# Patient Record
Sex: Female | Born: 1985
Health system: Southern US, Community
[De-identification: ages and names within clinical notes are randomized; demographics above are authoritative.]

## PROBLEM LIST (undated history)

## (undated) ENCOUNTER — Inpatient Hospital Stay (HOSPITAL_COMMUNITY): Payer: Self-pay

## (undated) DIAGNOSIS — J4520 Mild intermittent asthma, uncomplicated: Secondary | ICD-10-CM

## (undated) DIAGNOSIS — J302 Other seasonal allergic rhinitis: Secondary | ICD-10-CM

## (undated) DIAGNOSIS — K219 Gastro-esophageal reflux disease without esophagitis: Secondary | ICD-10-CM

## (undated) DIAGNOSIS — N859 Noninflammatory disorder of uterus, unspecified: Secondary | ICD-10-CM

## (undated) DIAGNOSIS — G43909 Migraine, unspecified, not intractable, without status migrainosus: Secondary | ICD-10-CM

## (undated) DIAGNOSIS — N926 Irregular menstruation, unspecified: Secondary | ICD-10-CM

## (undated) DIAGNOSIS — K589 Irritable bowel syndrome without diarrhea: Secondary | ICD-10-CM

## (undated) DIAGNOSIS — J45909 Unspecified asthma, uncomplicated: Secondary | ICD-10-CM

## (undated) DIAGNOSIS — R51 Headache: Secondary | ICD-10-CM

## (undated) DIAGNOSIS — R519 Headache, unspecified: Secondary | ICD-10-CM

## (undated) DIAGNOSIS — T7840XA Allergy, unspecified, initial encounter: Secondary | ICD-10-CM

## (undated) DIAGNOSIS — T8859XA Other complications of anesthesia, initial encounter: Secondary | ICD-10-CM

## (undated) HISTORY — DX: Headache: R51

## (undated) HISTORY — DX: Allergy, unspecified, initial encounter: T78.40XA

## (undated) HISTORY — DX: Migraine, unspecified, not intractable, without status migrainosus: G43.909

## (undated) HISTORY — DX: Headache, unspecified: R51.9

---

## 2009-02-13 DIAGNOSIS — Z87410 Personal history of cervical dysplasia: Secondary | ICD-10-CM

## 2009-02-13 HISTORY — DX: Personal history of cervical dysplasia: Z87.410

## 2009-02-13 HISTORY — PX: CERVICAL BIOPSY  W/ LOOP ELECTRODE EXCISION: SUR135

## 2012-09-09 ENCOUNTER — Encounter: Payer: Self-pay | Admitting: Gynecology

## 2012-09-09 ENCOUNTER — Ambulatory Visit (INDEPENDENT_AMBULATORY_CARE_PROVIDER_SITE_OTHER): Payer: BC Managed Care – PPO | Admitting: Gynecology

## 2012-09-09 VITALS — BP 110/60 | HR 80 | Resp 18 | Ht 62.5 in | Wt 158.0 lb

## 2012-09-09 DIAGNOSIS — Z87898 Personal history of other specified conditions: Secondary | ICD-10-CM | POA: Insufficient documentation

## 2012-09-09 DIAGNOSIS — Z8742 Personal history of other diseases of the female genital tract: Secondary | ICD-10-CM

## 2012-09-09 DIAGNOSIS — Z0000 Encounter for general adult medical examination without abnormal findings: Secondary | ICD-10-CM

## 2012-09-09 DIAGNOSIS — Z01419 Encounter for gynecological examination (general) (routine) without abnormal findings: Secondary | ICD-10-CM

## 2012-09-09 DIAGNOSIS — Z309 Encounter for contraceptive management, unspecified: Secondary | ICD-10-CM

## 2012-09-09 LAB — POCT URINALYSIS DIPSTICK: pH, UA: 7

## 2012-09-09 MED ORDER — ETONOGESTREL-ETHINYL ESTRADIOL 0.12-0.015 MG/24HR VA RING: VAGINAL_RING | VAGINAL | Status: DC

## 2012-09-09 NOTE — Progress Notes (Signed)
27 y.o. Married Caucasian female   G1P1001 here for annual exam. Pt is currently sexually active.  She reports  using condoms on a regular basis.  First sexual activity at 27 years old, 4number of lifetime partners. Pt is s/p c/s 78m ago for PPROM at 36w, pt delivered in [place]     Patient's last menstrual period was 08/14/2012.          Sexually active: yes  The current method of family planning is NuvaRing vaginal inserts.    Exercising: yes  walking sometimes Last pap: 7/13- Normal Alcohol: 5 drinks/month Tobacco: no Drugs: no Gardisil: not sure, completed:  Hgb: 12.9       Urine: Leuks 1  No health maintenance topics applied.  Family History  Problem Relation Age of Onset  . Cancer Maternal Aunt   . Osteoporosis Maternal Grandmother     There are no active problems to display for this patient.   No past medical history on file.  Past Surgical History  Procedure Laterality Date  . Cesarean section    . Cervical biopsy  w/ loop electrode excision  2011    HGSIL    Allergies: Penicillins  Current Outpatient Prescriptions  Medication Sig Dispense Refill  . Acetaminophen (TYLENOL PO) Take by mouth as needed.      Marland Kitchen albuterol (PROAIR HFA) 108 (90 BASE) MCG/ACT inhaler Inhale 2 puffs into the lungs every 6 (six) hours as needed for wheezing.      Marland Kitchen etonogestrel-ethinyl estradiol (NUVARING) 0.12-0.015 MG/24HR vaginal ring Place 1 each vaginally every 28 (twenty-eight) days. Insert vaginally and leave in place for 3 consecutive weeks, then remove for 1 week.       No current facility-administered medications for this visit.    ROS: Pertinent items are noted in HPI.  Exam:    LMP 08/14/2012 Weight change: @WEIGHTCHANGE @ Last 3 height recordings:  Ht Readings from Last 3 Encounters:  No data found for Ht   General appearance: alert, cooperative and appears stated age Head: Normocephalic, without obvious abnormality, atraumatic Neck: no adenopathy, no carotid bruit, no JVD,  supple, symmetrical, trachea midline and thyroid not enlarged, symmetric, no tenderness/mass/nodules Lungs: clear to auscultation bilaterally Breasts: normal appearance, no masses or tenderness Heart: regular rate and rhythm, S1, S2 normal, no murmur, click, rub or gallop Abdomen: soft, non-tender; bowel sounds normal; no masses,  no organomegaly Extremities: extremities normal, atraumatic, no cyanosis or edema Skin: Skin color, texture, turgor normal. No rashes or lesions Lymph nodes: Cervical, supraclavicular, and axillary nodes normal. no inguinal nodes palpated Neurologic: Grossly normal   Pelvic: External genitalia:  no lesions              Urethra: normal appearing urethra with no masses, tenderness or lesions              Bartholins and Skenes: normal                 Vagina: normal appearing vagina with normal color and discharge, no lesions              Cervix: normal appearance, LEEP scar2              Pap taken: yes        Bimanual Exam:  Uterus:  uterus is normal size, shape, consistency and nontender  Adnexa:    normal adnexa in size, nontender and no masses                                      Rectovaginal: Confirms                                      Anus:  normal sphincter tone, no lesion  A: well woman no contraindication to continue use of oral contraceptives Contraceptive management     P: pap smear annual due to HGSIL and LEEP counseled on breast self exam, use and side effects of OCP's return annually or prn    An After Visit Summary was printed and given to the patient.

## 2012-09-09 NOTE — Patient Instructions (Signed)
Place nuvaring first of month and remove on 28th

## 2013-06-30 LAB — OB RESULTS CONSOLE ANTIBODY SCREEN: Antibody Screen: NEGATIVE

## 2013-06-30 LAB — OB RESULTS CONSOLE RPR: RPR: NONREACTIVE

## 2013-06-30 LAB — OB RESULTS CONSOLE HEPATITIS B SURFACE ANTIGEN: Hepatitis B Surface Ag: NEGATIVE

## 2013-06-30 LAB — OB RESULTS CONSOLE HIV ANTIBODY (ROUTINE TESTING): HIV: NONREACTIVE

## 2013-06-30 LAB — OB RESULTS CONSOLE GC/CHLAMYDIA
CHLAMYDIA, DNA PROBE: NEGATIVE
Gonorrhea: NEGATIVE

## 2013-06-30 LAB — OB RESULTS CONSOLE ABO/RH: RH Type: NEGATIVE

## 2013-06-30 LAB — OB RESULTS CONSOLE RUBELLA ANTIBODY, IGM: Rubella: IMMUNE

## 2013-12-15 ENCOUNTER — Encounter: Payer: Self-pay | Admitting: Gynecology

## 2014-01-04 ENCOUNTER — Encounter (HOSPITAL_COMMUNITY): Payer: Self-pay | Admitting: *Deleted

## 2014-01-04 ENCOUNTER — Inpatient Hospital Stay (HOSPITAL_COMMUNITY)
Admission: AD | Admit: 2014-01-04 | Discharge: 2014-01-04 | Disposition: A | Discharge status: Home / Self Care | Payer: BC Managed Care – PPO | Source: Ambulatory Visit | Attending: Obstetrics and Gynecology | Admitting: Obstetrics and Gynecology

## 2014-01-04 DIAGNOSIS — N898 Other specified noninflammatory disorders of vagina: Secondary | ICD-10-CM | POA: Diagnosis not present

## 2014-01-04 DIAGNOSIS — O26893 Other specified pregnancy related conditions, third trimester: Secondary | ICD-10-CM | POA: Insufficient documentation

## 2014-01-04 DIAGNOSIS — Z3A38 38 weeks gestation of pregnancy: Secondary | ICD-10-CM | POA: Insufficient documentation

## 2014-01-04 LAB — WET PREP, GENITAL
Clue Cells Wet Prep HPF POC: NONE SEEN
Trich, Wet Prep: NONE SEEN
YEAST WET PREP: NONE SEEN

## 2014-01-04 LAB — AMNISURE RUPTURE OF MEMBRANE (ROM) NOT AT ARMC: Amnisure ROM: NEGATIVE

## 2014-01-04 NOTE — MAU Note (Signed)
Pt reports she thinks she is leaking fluid, first noticed it on Thursday. Contractions off/on. Reports good fetal movement.

## 2014-01-04 NOTE — MAU Provider Note (Signed)
History   Patient of Dr. Carmelina NounJ. Bovard who was mistakenly registered under [facility] care.  Assessment was performed and completed prior to correction of care noted. 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  Patient reports leaking started Thursday and has required frequent changing of underwear.  Patient also states that fluid has went from white colored to clear, but denies smell.  Patient states that she has been having abnormal contractions and had sexual intercourse yesterday.  After this, patient reports an incident of vaginal bleeding that "filled up the toilet."  Patient is scheduled for repeat c/s on Monday.   Patient Active Problem List   Diagnosis Date Noted  . History of abnormal Pap smear 09/09/2012    Chief Complaint  Patient presents with  . Rupture of Membranes  . Labor Eval   HPI  OB History    Gravida Para Term Preterm AB TAB SAB Ectopic Multiple Living   2 1 1       1       History reviewed. No pertinent past medical history.  Past Surgical History  Procedure Laterality Date  . Cesarean section    . Cervical biopsy  w/ loop electrode excision  2011    HGSIL    Family History  Problem Relation Age of Onset  . Cancer Maternal Aunt   . Osteoporosis Maternal Grandmother     History  Substance Use Topics  . Smoking status: Never Smoker   . Smokeless tobacco: Never Used  . Alcohol Use: Yes     Comment: 5 drinks/month    Allergies:  Allergies  Allergen Reactions  . Penicillins Other (See Comments)    Reaction:  High fever    Prescriptions prior to admission  Medication Sig Dispense Refill Last Dose  . acetaminophen (TYLENOL) 500 MG tablet Take 1,000 mg by mouth every 6 (six) hours as needed for mild pain or headache.   Past Week at Unknown time  . albuterol (PROAIR HFA) 108 (90 BASE) MCG/ACT inhaler Inhale 2 puffs into the lungs every 6 (six) hours as needed for wheezing or shortness of breath.    01/04/2014 at Unknown  time  . fluticasone (FLOVENT HFA) 110 MCG/ACT inhaler Inhale 1 puff into the lungs 2 (two) times daily.   01/04/2014 at Unknown time  . montelukast (SINGULAIR) 10 MG tablet Take 10 mg by mouth daily.   01/04/2014 at Unknown time  . Prenatal Vit-Fe Fumarate-FA (PRENATAL MULTIVITAMIN) TABS tablet Take 1 tablet by mouth daily.    01/03/2014 at Unknown time  . sodium chloride (OCEAN) 0.65 % SOLN nasal spray Place 1 spray into both nostrils at bedtime as needed for congestion.   01/03/2014 at Unknown time  . Acetaminophen (TYLENOL PO) Take 1,000 mg by mouth 2 (two) times daily as needed (for headaches).      Marland Kitchen. etonogestrel-ethinyl estradiol (NUVARING) 0.12-0.015 MG/24HR vaginal ring Insert vaginally and leave in place for 4 consecutive weeks, then remove for 3 days. (Patient not taking: Reported on 12/26/2013) 3 each 3     ROS  See HPI Above Physical Exam   Blood pressure 124/71, temperature 98.2 F (36.8 C), temperature source Oral, resp. rate 18, height 5\' 3"  (1.6 m), weight 181 lb 6 oz (82.271 kg), SpO2 98 %.  Physical Exam  Constitutional: She is oriented to person, place, and time. She appears well-developed and well-nourished.  Cardiovascular: Normal rate, regular rhythm and normal heart sounds.   GI: Soft. Bowel  sounds are normal.  Appears gravid--fundal height AGA, Soft, NT   Genitourinary: Cervix exhibits discharge. Cervix exhibits no motion tenderness. No bleeding in the vagina. Vaginal discharge found.  Sterile Speculum Exam: -Vaginal Vault: Thick brownish discharge noted (possible old blood)-wet prep & Amnisure collected -Cervix: Mucoid discharge from os. No active bleeding or leaking of fluid noted. Bimanual Exam: 0/50/-3  Neurological: She is alert and oriented to person, place, and time.  Skin: Skin is warm and dry.   FHR: 135 bpm, Mod Var, -Decels, +Accels UC: None graphed or palpated  ED Course  Assessment: IUP at 38wks Cat I FT Vaginal Discharge  Plan: -PE as  above -Wet prep-Collected & Pending -Amnisure-Collected & Pending -Report given to Dr. Stephanie AcreBovard  Saylor Sheckler LYNN CNM, MSN 01/04/2014 9:55 PM

## 2014-01-04 NOTE — MAU Note (Signed)
Assumed care of patient.

## 2014-01-04 NOTE — Discharge Instructions (Signed)
Braxton Hicks Contractions °Contractions of the uterus can occur throughout pregnancy. Contractions are not always a sign that you are in labor.  °WHAT ARE BRAXTON HICKS CONTRACTIONS?  °Contractions that occur before labor are called Braxton Hicks contractions, or false labor. Toward the end of pregnancy (32-34 weeks), these contractions can develop more often and may become more forceful. This is not true labor because these contractions do not result in opening (dilatation) and thinning of the cervix. They are sometimes difficult to tell apart from true labor because these contractions can be forceful and people have different pain tolerances. You should not feel embarrassed if you go to the hospital with false labor. Sometimes, the only way to tell if you are in true labor is for your health care provider to look for changes in the cervix. °If there are no prenatal problems or other health problems associated with the pregnancy, it is completely safe to be sent home with false labor and await the onset of true labor. °HOW CAN YOU TELL THE DIFFERENCE BETWEEN TRUE AND FALSE LABOR? °False Labor °· The contractions of false labor are usually shorter and not as hard as those of true labor.   °· The contractions are usually irregular.   °· The contractions are often felt in the front of the lower abdomen and in the groin.   °· The contractions may go away when you walk around or change positions while lying down.   °· The contractions get weaker and are shorter lasting as time goes on.   °· The contractions do not usually become progressively stronger, regular, and closer together as with true labor.   °True Labor °1. Contractions in true labor last 30-70 seconds, become very regular, usually become more intense, and increase in frequency.   °2. The contractions do not go away with walking.   °3. The discomfort is usually felt in the top of the uterus and spreads to the lower abdomen and low back.   °4. True labor can  be determined by your health care provider with an exam. This will show that the cervix is dilating and getting thinner.   °WHAT TO REMEMBER °· Keep up with your usual exercises and follow other instructions given by your health care provider.   °· Take medicines as directed by your health care provider.   °· Keep your regular prenatal appointments.   °· Eat and drink lightly if you think you are going into labor.   °· If Braxton Hicks contractions are making you uncomfortable:   °· Change your position from lying down or resting to walking, or from walking to resting.   °· Sit and rest in a tub of warm water.   °· Drink 2-3 glasses of water. Dehydration may cause these contractions.   °· Do slow and deep breathing several times an hour.   °WHEN SHOULD I SEEK IMMEDIATE MEDICAL CARE? °Seek immediate medical care if: °· Your contractions become stronger, more regular, and closer together.   °· You have fluid leaking or gushing from your vagina.   °· You have a fever.   °· You pass blood-tinged mucus.   °· You have vaginal bleeding.   °· You have continuous abdominal pain.   °· You have low back pain that you never had before.   °· You feel your baby's head pushing down and causing pelvic pressure.   °· Your baby is not moving as much as it used to.   °Document Released: 01/30/2005 Document Revised: 02/04/2013 Document Reviewed: 11/11/2012 °ExitCare® Patient Information ©2015 [employer]. This information is not intended to replace advice given to you by your health care   provider. Make sure you discuss any questions you have with your health care provider. ° °Fetal Movement Counts °Patient Name: __________________________________________________ Patient Due Date: ____________________ °Performing a fetal movement count is highly recommended in high-risk pregnancies, but it is good for every pregnant woman to do. Your health care provider may ask you to start counting fetal movements at 28 weeks of the pregnancy. Fetal  movements often increase: °· After eating a full meal. °· After physical activity. °· After eating or drinking something sweet or cold. °· At rest. °Pay attention to when you feel the baby is most active. This will help you notice a pattern of your baby's sleep and wake cycles and what factors contribute to an increase in fetal movement. It is important to perform a fetal movement count at the same time each day when your baby is normally most active.  °HOW TO COUNT FETAL MOVEMENTS °5. Find a quiet and comfortable area to sit or lie down on your left side. Lying on your left side provides the best blood and oxygen circulation to your baby. °6. Write down the day and time on a sheet of paper or in a journal. °7. Start counting kicks, flutters, swishes, rolls, or jabs in a 2-hour period. You should feel at least 10 movements within 2 hours. °8. If you do not feel 10 movements in 2 hours, wait 2-3 hours and count again. Look for a change in the pattern or not enough counts in 2 hours. °SEEK MEDICAL CARE IF: °· You feel less than 10 counts in 2 hours, tried twice. °· There is no movement in over an hour. °· The pattern is changing or taking longer each day to reach 10 counts in 2 hours. °· You feel the baby is not moving as he or she usually does. °Date: ____________ Movements: ____________ Start time: ____________ Finish time: ____________  °Date: ____________ Movements: ____________ Start time: ____________ Finish time: ____________ °Date: ____________ Movements: ____________ Start time: ____________ Finish time: ____________ °Date: ____________ Movements: ____________ Start time: ____________ Finish time: ____________ °Date: ____________ Movements: ____________ Start time: ____________ Finish time: ____________ °Date: ____________ Movements: ____________ Start time: ____________ Finish time: ____________ °Date: ____________ Movements: ____________ Start time: ____________ Finish time: ____________ °Date: ____________  Movements: ____________ Start time: ____________ Finish time: ____________  °Date: ____________ Movements: ____________ Start time: ____________ Finish time: ____________ °Date: ____________ Movements: ____________ Start time: ____________ Finish time: ____________ °Date: ____________ Movements: ____________ Start time: ____________ Finish time: ____________ °Date: ____________ Movements: ____________ Start time: ____________ Finish time: ____________ °Date: ____________ Movements: ____________ Start time: ____________ Finish time: ____________ °Date: ____________ Movements: ____________ Start time: ____________ Finish time: ____________ °Date: ____________ Movements: ____________ Start time: ____________ Finish time: ____________  °Date: ____________ Movements: ____________ Start time: ____________ Finish time: ____________ °Date: ____________ Movements: ____________ Start time: ____________ Finish time: ____________ °Date: ____________ Movements: ____________ Start time: ____________ Finish time: ____________ °Date: ____________ Movements: ____________ Start time: ____________ Finish time: ____________ °Date: ____________ Movements: ____________ Start time: ____________ Finish time: ____________ °Date: ____________ Movements: ____________ Start time: ____________ Finish time: ____________ °Date: ____________ Movements: ____________ Start time: ____________ Finish time: ____________  °Date: ____________ Movements: ____________ Start time: ____________ Finish time: ____________ °Date: ____________ Movements: ____________ Start time: ____________ Finish time: ____________ °Date: ____________ Movements: ____________ Start time: ____________ Finish time: ____________ °Date: ____________ Movements: ____________ Start time: ____________ Finish time: ____________ °Date: ____________ Movements: ____________ Start time: ____________ Finish time: ____________ °Date: ____________ Movements: ____________ Start time:  ____________ Finish time: ____________ °Date: ____________ Movements:   ____________ Start time: ____________ Finish time: ____________  °Date: ____________ Movements: ____________ Start time: ____________ Finish time: ____________ °Date: ____________ Movements: ____________ Start time: ____________ Finish time: ____________ °Date: ____________ Movements: ____________ Start time: ____________ Finish time: ____________ °Date: ____________ Movements: ____________ Start time: ____________ Finish time: ____________ °Date: ____________ Movements: ____________ Start time: ____________ Finish time: ____________ °Date: ____________ Movements: ____________ Start time: ____________ Finish time: ____________ °Date: ____________ Movements: ____________ Start time: ____________ Finish time: ____________  °Date: ____________ Movements: ____________ Start time: ____________ Finish time: ____________ °Date: ____________ Movements: ____________ Start time: ____________ Finish time: ____________ °Date: ____________ Movements: ____________ Start time: ____________ Finish time: ____________ °Date: ____________ Movements: ____________ Start time: ____________ Finish time: ____________ °Date: ____________ Movements: ____________ Start time: ____________ Finish time: ____________ °Date: ____________ Movements: ____________ Start time: ____________ Finish time: ____________ °Date: ____________ Movements: ____________ Start time: ____________ Finish time: ____________  °Date: ____________ Movements: ____________ Start time: ____________ Finish time: ____________ °Date: ____________ Movements: ____________ Start time: ____________ Finish time: ____________ °Date: ____________ Movements: ____________ Start time: ____________ Finish time: ____________ °Date: ____________ Movements: ____________ Start time: ____________ Finish time: ____________ °Date: ____________ Movements: ____________ Start time: ____________ Finish time: ____________ °Date:  ____________ Movements: ____________ Start time: ____________ Finish time: ____________ °Date: ____________ Movements: ____________ Start time: ____________ Finish time: ____________  °Date: ____________ Movements: ____________ Start time: ____________ Finish time: ____________ °Date: ____________ Movements: ____________ Start time: ____________ Finish time: ____________ °Date: ____________ Movements: ____________ Start time: ____________ Finish time: ____________ °Date: ____________ Movements: ____________ Start time: ____________ Finish time: ____________ °Date: ____________ Movements: ____________ Start time: ____________ Finish time: ____________ °Date: ____________ Movements: ____________ Start time: ____________ Finish time: ____________ °Document Released: 03/01/2006 Document Revised: 06/16/2013 Document Reviewed: 11/27/2011 °[employer]® Patient Information ©2015 [employer]. This information is not intended to replace advice given to you by your health care provider. Make sure you discuss any questions you have with your health care provider. ° °

## 2014-01-09 ENCOUNTER — Encounter (HOSPITAL_COMMUNITY)
Admission: RE | Admit: 2014-01-09 | Discharge: 2014-01-09 | Disposition: A | Discharge status: Home / Self Care | Payer: BC Managed Care – PPO | Source: Ambulatory Visit | Attending: Obstetrics and Gynecology | Admitting: Obstetrics and Gynecology

## 2014-01-09 ENCOUNTER — Encounter (HOSPITAL_COMMUNITY): Payer: Self-pay

## 2014-01-09 HISTORY — DX: Unspecified asthma, uncomplicated: J45.909

## 2014-01-09 LAB — CBC
HEMATOCRIT: 36.5 % (ref 36.0–46.0)
Hemoglobin: 12.3 g/dL (ref 12.0–15.0)
MCH: 31.4 pg (ref 26.0–34.0)
MCHC: 33.7 g/dL (ref 30.0–36.0)
MCV: 93.1 fL (ref 78.0–100.0)
PLATELETS: 153 10*3/uL (ref 150–400)
RBC: 3.92 MIL/uL (ref 3.87–5.11)
RDW: 14 % (ref 11.5–15.5)
WBC: 9.2 10*3/uL (ref 4.0–10.5)

## 2014-01-09 LAB — RPR

## 2014-01-09 NOTE — Patient Instructions (Addendum)
Your procedure is scheduled on: 01/12/14  Enter through the Main Entrance at :7:45 am  Pick up desk phone and dial [phone] and inform us of your arrival.  Please call [phone] if you have any problems the morning of surgery.  Remember: Do not eat food or drink liquids, including water, after midnight:Sunday   You may brush your teeth the morning of surgery.  Take these meds the morning of surgery with a sip of water:Singulair- bring inhaler to hospital the day of surgery  DO NOT wear jewelry, eye make-up, lipstick,body lotion, or dark fingernail polish.  (Polished toes are ok) You may wear deodorant.  If you are to be admitted after surgery, leave suitcase in car until your room has been assigned. Patients discharged on the day of surgery will not be allowed to drive home. Wear loose fitting, comfortable clothes for your ride home.

## 2014-01-10 NOTE — H&P (Signed)
Allison Kane is a 28 y.o. female G2P1001 at 39+ for rLTCS/BTL.  Pregnancy complicated by GDM, diet controlled.  Has asthma  Controlled with Flonase, Singulaor. H/O LEEP, last ap WNL.   Also RH negative, received rhogam.  +FM, no LOF, no VB, occ ctx.  D/W pr LTCS inc r/b/a - desires rLTCS.  Also d/w pt BTL, desires.    Maternal Medical History:  Contractions: Frequency: irregular.    Fetal activity: Perceived fetal activity is normal.    Prenatal Complications - Diabetes: gestational. Diabetes is managed by diet.      OB History    Gravida Para Term Preterm AB TAB SAB Ectopic Multiple Living   2 1 1       1     G1 12/31 36wk, LTCS 5#8 G2 present  H/o abn pap, last WNL No STDs  Past Medical History  Diagnosis Date  . Asthma    Past Surgical History  Procedure Laterality Date  . Cesarean section    . Cervical biopsy  w/ loop electrode excision  2011    HGSIL   Family History: HTN, CHF, IBS, COPD, DM Social History:  reports that she has never smoked. She has never used smokeless tobacco. She reports that she drinks alcohol. She reports that she does not use illicit drugs.married Meds: Flonase, Singulair, PNV All latex, NKDA   Prenatal Transfer Tool  Maternal Diabetes: Yes:  Diabetes Type:  Diet controlled Genetic Screening: Normal Maternal Ultrasounds/Referrals: Normal Fetal Ultrasounds or other Referrals:  None Maternal Substance Abuse:  No Significant Maternal Medications:  None Significant Maternal Lab Results:  Lab values include: Group B Strep negative Other Comments:  None  Review of Systems  Constitutional: Negative.   HENT: Negative.   Eyes: Negative.   Respiratory: Negative.   Cardiovascular: Negative.   Gastrointestinal: Negative.   Genitourinary: Negative.   Musculoskeletal: Negative.   Skin: Negative.   Neurological: Negative.   Psychiatric/Behavioral: Negative.       Last menstrual period 04/13/2013. Maternal Exam:  Uterine Assessment:  Contraction frequency is rare.   Abdomen: Fundal height is appropriate for gestation.   Estimated fetal weight is 6.5-7#.   Fetal presentation: vertex  Introitus: Normal vulva. Normal vagina.  Cervix: Cervix evaluated by digital exam.     Physical Exam  Constitutional: She is oriented to person, place, and time. She appears well-developed and well-nourished.  HENT:  Head: Normocephalic and atraumatic.  Cardiovascular: Normal rate and regular rhythm.   Respiratory: Effort normal and breath sounds normal. No respiratory distress. She has no wheezes.  GI: Soft. Bowel sounds are normal. She exhibits no distension. There is no tenderness.  Musculoskeletal: Normal range of motion.  Neurological: She is alert and oriented to person, place, and time.  Skin: Skin is warm and dry.  Psychiatric: She has a normal mood and affect. Her behavior is normal.    Prenatal labs: ABO, Rh: --/--/A NEG (11/27 1154) Antibody: POS (11/27 1154) Rubella: Immune (05/18 0000) RPR: NON REAC (11/27 1154)  HBsAg: Negative (05/18 0000)  HIV: Non-reactive (05/18 0000)  GBS:   negative  Hgb 12.9/Plt 211K/ Ur Cx neg/GC neg/ Chl neg/glucola 151 - 3hr GTT elevated/Toxo IgG neg/CF +/ FOB neg  Nl anat, post plac,gender reveal Rhogam 9/15   Assessment/Plan: G2P1001 at 39+ for rLTCS/BTL - d/w pt r/b/a wishes to proceed Ancef for prophylaxis GBBS neg GDM - closely monitor baby's FSBS   Bovard-Stuckert, Tyler Robidoux 01/10/2014, 9:51 PM

## 2014-01-12 ENCOUNTER — Inpatient Hospital Stay (HOSPITAL_COMMUNITY)
Admission: RE | Admit: 2014-01-12 | Discharge: 2014-01-14 | DRG: 766 | Disposition: A | Discharge status: Home / Self Care | Payer: BC Managed Care – PPO | Source: Ambulatory Visit | Attending: Obstetrics and Gynecology | Admitting: Obstetrics and Gynecology

## 2014-01-12 ENCOUNTER — Inpatient Hospital Stay (HOSPITAL_COMMUNITY): Payer: BC Managed Care – PPO | Admitting: Anesthesiology

## 2014-01-12 ENCOUNTER — Encounter (HOSPITAL_COMMUNITY): Payer: Self-pay | Admitting: Anesthesiology

## 2014-01-12 ENCOUNTER — Encounter (HOSPITAL_COMMUNITY)
Admission: RE | Disposition: A | Discharge status: Home / Self Care | Payer: Self-pay | Source: Ambulatory Visit | Attending: Obstetrics and Gynecology

## 2014-01-12 DIAGNOSIS — Z3A39 39 weeks gestation of pregnancy: Secondary | ICD-10-CM | POA: Diagnosis present

## 2014-01-12 DIAGNOSIS — O9952 Diseases of the respiratory system complicating childbirth: Secondary | ICD-10-CM | POA: Diagnosis present

## 2014-01-12 DIAGNOSIS — J45909 Unspecified asthma, uncomplicated: Secondary | ICD-10-CM | POA: Diagnosis present

## 2014-01-12 DIAGNOSIS — Z98891 History of uterine scar from previous surgery: Secondary | ICD-10-CM

## 2014-01-12 DIAGNOSIS — Z3483 Encounter for supervision of other normal pregnancy, third trimester: Secondary | ICD-10-CM | POA: Diagnosis present

## 2014-01-12 DIAGNOSIS — O24420 Gestational diabetes mellitus in childbirth, diet controlled: Secondary | ICD-10-CM | POA: Diagnosis present

## 2014-01-12 HISTORY — DX: Gastro-esophageal reflux disease without esophagitis: K21.9

## 2014-01-12 HISTORY — DX: Irritable bowel syndrome, unspecified: K58.9

## 2014-01-12 SURGERY — Surgical Case
Anesthesia: Spinal | Site: Abdomen

## 2014-01-12 MED ORDER — ONDANSETRON HCL 4 MG PO TABS: 4.0000 mg | ORAL_TABLET | ORAL | Status: DC | PRN

## 2014-01-12 MED ORDER — FENTANYL CITRATE 0.05 MG/ML IJ SOLN
INTRAMUSCULAR | Status: AC
  Filled 2014-01-12: qty 2

## 2014-01-12 MED ORDER — CEFAZOLIN SODIUM-DEXTROSE 2-3 GM-% IV SOLR
INTRAVENOUS | Status: AC
  Filled 2014-01-12: qty 50

## 2014-01-12 MED ORDER — ALBUTEROL SULFATE HFA 108 (90 BASE) MCG/ACT IN AERS
2.0000 | INHALATION_SPRAY | Freq: Four times a day (QID) | RESPIRATORY_TRACT | Status: DC | PRN
  Filled 2014-01-12: qty 6.7

## 2014-01-12 MED ORDER — SIMETHICONE 80 MG PO CHEW
80.0000 mg | CHEWABLE_TABLET | Freq: Three times a day (TID) | ORAL | Status: DC
  Administered 2014-01-13 – 2014-01-14 (×4): 80 mg via ORAL
  Filled 2014-01-12 (×2): qty 1

## 2014-01-12 MED ORDER — NALOXONE HCL 1 MG/ML IJ SOLN
1.0000 ug/kg/h | INTRAVENOUS | Status: DC | PRN
  Filled 2014-01-12: qty 2

## 2014-01-12 MED ORDER — OXYCODONE-ACETAMINOPHEN 5-325 MG PO TABS
2.0000 | ORAL_TABLET | ORAL | Status: DC | PRN
  Administered 2014-01-14: 2 via ORAL
  Filled 2014-01-12: qty 2

## 2014-01-12 MED ORDER — OXYCODONE-ACETAMINOPHEN 5-325 MG PO TABS
1.0000 | ORAL_TABLET | ORAL | Status: DC | PRN
  Administered 2014-01-13 (×2): 1 via ORAL
  Filled 2014-01-12 (×2): qty 1

## 2014-01-12 MED ORDER — DIBUCAINE 1 % RE OINT: 1.0000 "application " | TOPICAL_OINTMENT | RECTAL | Status: DC | PRN

## 2014-01-12 MED ORDER — OXYTOCIN 40 UNITS IN LACTATED RINGERS INFUSION - SIMPLE MED: 62.5000 mL/h | INTRAVENOUS | Status: AC

## 2014-01-12 MED ORDER — ONDANSETRON HCL 4 MG/2ML IJ SOLN: 4.0000 mg | Freq: Three times a day (TID) | INTRAMUSCULAR | Status: DC | PRN

## 2014-01-12 MED ORDER — MENTHOL 3 MG MT LOZG: 1.0000 | LOZENGE | OROMUCOSAL | Status: DC | PRN

## 2014-01-12 MED ORDER — IBUPROFEN 800 MG PO TABS
800.0000 mg | ORAL_TABLET | Freq: Three times a day (TID) | ORAL | Status: DC
  Administered 2014-01-13 – 2014-01-14 (×5): 800 mg via ORAL
  Filled 2014-01-12 (×6): qty 1

## 2014-01-12 MED ORDER — NALBUPHINE HCL 10 MG/ML IJ SOLN
5.0000 mg | INTRAMUSCULAR | Status: DC | PRN
  Administered 2014-01-13: 5 mg via INTRAVENOUS
  Filled 2014-01-12: qty 1

## 2014-01-12 MED ORDER — ONDANSETRON HCL 4 MG/2ML IJ SOLN
INTRAMUSCULAR | Status: AC
  Filled 2014-01-12: qty 2

## 2014-01-12 MED ORDER — MORPHINE SULFATE 0.5 MG/ML IJ SOLN
INTRAMUSCULAR | Status: AC
  Filled 2014-01-12: qty 10

## 2014-01-12 MED ORDER — PHENYLEPHRINE 8 MG IN D5W 100 ML (0.08MG/ML) PREMIX OPTIME
INJECTION | INTRAVENOUS | Status: DC | PRN
  Administered 2014-01-12: 70 ug/min via INTRAVENOUS

## 2014-01-12 MED ORDER — LACTATED RINGERS IV SOLN
Freq: Once | INTRAVENOUS | Status: AC
  Administered 2014-01-12: 08:00:00 via INTRAVENOUS

## 2014-01-12 MED ORDER — SENNOSIDES-DOCUSATE SODIUM 8.6-50 MG PO TABS
2.0000 | ORAL_TABLET | ORAL | Status: DC
  Administered 2014-01-12 – 2014-01-13 (×2): 2 via ORAL
  Filled 2014-01-12 (×2): qty 2

## 2014-01-12 MED ORDER — LACTATED RINGERS IV SOLN
40.0000 [IU] | INTRAVENOUS | Status: DC | PRN
Start: 2014-01-12 — End: 2014-01-12
  Administered 2014-01-12: 40 [IU] via INTRAVENOUS

## 2014-01-12 MED ORDER — SCOPOLAMINE 1 MG/3DAYS TD PT72
1.0000 | MEDICATED_PATCH | Freq: Once | TRANSDERMAL | Status: DC
  Administered 2014-01-12: 1.5 mg via TRANSDERMAL

## 2014-01-12 MED ORDER — ALBUTEROL SULFATE (2.5 MG/3ML) 0.083% IN NEBU: 2.5000 mg | INHALATION_SOLUTION | Freq: Four times a day (QID) | RESPIRATORY_TRACT | Status: DC | PRN

## 2014-01-12 MED ORDER — ONDANSETRON HCL 4 MG/2ML IJ SOLN
INTRAMUSCULAR | Status: DC | PRN
  Administered 2014-01-12: 4 mg via INTRAVENOUS

## 2014-01-12 MED ORDER — KETOROLAC TROMETHAMINE 30 MG/ML IJ SOLN
30.0000 mg | Freq: Four times a day (QID) | INTRAMUSCULAR | Status: AC
  Administered 2014-01-12 (×2): 30 mg via INTRAVENOUS
  Filled 2014-01-12 (×2): qty 1

## 2014-01-12 MED ORDER — MEPERIDINE HCL 25 MG/ML IJ SOLN: 6.2500 mg | INTRAMUSCULAR | Status: DC | PRN

## 2014-01-12 MED ORDER — PRENATAL MULTIVITAMIN CH: 1.0000 | ORAL_TABLET | Freq: Every day | ORAL | Status: DC

## 2014-01-12 MED ORDER — FLUTICASONE PROPIONATE HFA 110 MCG/ACT IN AERO
1.0000 | INHALATION_SPRAY | Freq: Two times a day (BID) | RESPIRATORY_TRACT | Status: DC
  Administered 2014-01-13 – 2014-01-14 (×2): 1 via RESPIRATORY_TRACT
  Filled 2014-01-12: qty 12

## 2014-01-12 MED ORDER — LACTATED RINGERS IV SOLN: INTRAVENOUS | Status: DC

## 2014-01-12 MED ORDER — SIMETHICONE 80 MG PO CHEW: 80.0000 mg | CHEWABLE_TABLET | ORAL | Status: DC | PRN

## 2014-01-12 MED ORDER — NALBUPHINE HCL 10 MG/ML IJ SOLN: 5.0000 mg | INTRAMUSCULAR | Status: DC | PRN

## 2014-01-12 MED ORDER — BUPIVACAINE IN DEXTROSE 0.75-8.25 % IT SOLN
INTRATHECAL | Status: DC | PRN
  Administered 2014-01-12: 1.4 mL via INTRATHECAL

## 2014-01-12 MED ORDER — SODIUM CHLORIDE 0.9 % IJ SOLN: 3.0000 mL | INTRAMUSCULAR | Status: DC | PRN

## 2014-01-12 MED ORDER — LANOLIN HYDROUS EX OINT: 1.0000 "application " | TOPICAL_OINTMENT | CUTANEOUS | Status: DC | PRN

## 2014-01-12 MED ORDER — MORPHINE SULFATE (PF) 0.5 MG/ML IJ SOLN
INTRAMUSCULAR | Status: DC | PRN
  Administered 2014-01-12: .2 mg via EPIDURAL

## 2014-01-12 MED ORDER — NALBUPHINE HCL 10 MG/ML IJ SOLN: 5.0000 mg | Freq: Once | INTRAMUSCULAR | Status: AC | PRN

## 2014-01-12 MED ORDER — PHENYLEPHRINE 8 MG IN D5W 100 ML (0.08MG/ML) PREMIX OPTIME
INJECTION | INTRAVENOUS | Status: AC
  Filled 2014-01-12: qty 100

## 2014-01-12 MED ORDER — KETOROLAC TROMETHAMINE 30 MG/ML IJ SOLN
INTRAMUSCULAR | Status: AC
  Filled 2014-01-12: qty 1

## 2014-01-12 MED ORDER — NALOXONE HCL 0.4 MG/ML IJ SOLN: 0.4000 mg | INTRAMUSCULAR | Status: DC | PRN

## 2014-01-12 MED ORDER — OXYTOCIN 10 UNIT/ML IJ SOLN
INTRAMUSCULAR | Status: AC
  Filled 2014-01-12: qty 4

## 2014-01-12 MED ORDER — ONDANSETRON HCL 4 MG/2ML IJ SOLN: 4.0000 mg | INTRAMUSCULAR | Status: DC | PRN

## 2014-01-12 MED ORDER — WITCH HAZEL-GLYCERIN EX PADS: 1.0000 "application " | MEDICATED_PAD | CUTANEOUS | Status: DC | PRN

## 2014-01-12 MED ORDER — FENTANYL CITRATE 0.05 MG/ML IJ SOLN
INTRAMUSCULAR | Status: DC | PRN
  Administered 2014-01-12: 25 ug via INTRAVENOUS

## 2014-01-12 MED ORDER — DIPHENHYDRAMINE HCL 25 MG PO CAPS: 25.0000 mg | ORAL_CAPSULE | ORAL | Status: DC | PRN

## 2014-01-12 MED ORDER — LACTATED RINGERS IV SOLN
INTRAVENOUS | Status: DC
  Administered 2014-01-12 (×2): via INTRAVENOUS

## 2014-01-12 MED ORDER — ZOLPIDEM TARTRATE 5 MG PO TABS: 5.0000 mg | ORAL_TABLET | Freq: Every evening | ORAL | Status: DC | PRN

## 2014-01-12 MED ORDER — DIPHENHYDRAMINE HCL 50 MG/ML IJ SOLN: 12.5000 mg | INTRAMUSCULAR | Status: DC | PRN

## 2014-01-12 MED ORDER — PHENYLEPHRINE HCL 10 MG/ML IJ SOLN
INTRAMUSCULAR | Status: DC | PRN
  Administered 2014-01-12: 40 ug via INTRAVENOUS

## 2014-01-12 MED ORDER — CEFAZOLIN SODIUM-DEXTROSE 2-3 GM-% IV SOLR
2.0000 g | INTRAVENOUS | Status: AC
  Administered 2014-01-12: 2 g via INTRAVENOUS

## 2014-01-12 MED ORDER — 0.9 % SODIUM CHLORIDE (POUR BTL) OPTIME
TOPICAL | Status: DC | PRN
  Administered 2014-01-12: 1000 mL

## 2014-01-12 MED ORDER — FENTANYL CITRATE 0.05 MG/ML IJ SOLN
25.0000 ug | INTRAMUSCULAR | Status: DC | PRN
  Administered 2014-01-12: 50 ug via INTRAVENOUS

## 2014-01-12 MED ORDER — DIPHENHYDRAMINE HCL 25 MG PO CAPS: 25.0000 mg | ORAL_CAPSULE | Freq: Four times a day (QID) | ORAL | Status: DC | PRN

## 2014-01-12 MED ORDER — SIMETHICONE 80 MG PO CHEW
80.0000 mg | CHEWABLE_TABLET | ORAL | Status: DC
  Administered 2014-01-12 – 2014-01-13 (×2): 80 mg via ORAL
  Filled 2014-01-12 (×2): qty 1

## 2014-01-12 MED ORDER — MONTELUKAST SODIUM 10 MG PO TABS
10.0000 mg | ORAL_TABLET | Freq: Every day | ORAL | Status: DC
  Administered 2014-01-14: 10 mg via ORAL
  Filled 2014-01-12 (×4): qty 1

## 2014-01-12 MED ORDER — SCOPOLAMINE 1 MG/3DAYS TD PT72
MEDICATED_PATCH | TRANSDERMAL | Status: AC
  Administered 2014-01-12: 1.5 mg via TRANSDERMAL
  Filled 2014-01-12: qty 1

## 2014-01-12 MED ORDER — LACTATED RINGERS IV SOLN
INTRAVENOUS | Status: DC
  Administered 2014-01-12: 09:00:00 via INTRAVENOUS

## 2014-01-12 SURGICAL SUPPLY — 38 items
BENZOIN TINCTURE PRP APPL 2/3 (GAUZE/BANDAGES/DRESSINGS) ×3 IMPLANT
CLAMP CORD UMBIL (MISCELLANEOUS) ×9 IMPLANT
CLOSURE WOUND 1/2 X4 (GAUZE/BANDAGES/DRESSINGS) ×1
CLOTH BEACON ORANGE TIMEOUT ST (SAFETY) ×3 IMPLANT
CONTAINER PREFILL 10% NBF 15ML (MISCELLANEOUS) IMPLANT
DRAPE SHEET LG 3/4 BI-LAMINATE (DRAPES) ×3 IMPLANT
DRSG OPSITE POSTOP 4X10 (GAUZE/BANDAGES/DRESSINGS) ×3 IMPLANT
DURAPREP 26ML APPLICATOR (WOUND CARE) ×3 IMPLANT
ELECT REM PT RETURN 9FT ADLT (ELECTROSURGICAL) ×3
ELECTRODE REM PT RTRN 9FT ADLT (ELECTROSURGICAL) ×1 IMPLANT
EXTRACTOR VACUUM M CUP 4 TUBE (SUCTIONS) IMPLANT
EXTRACTOR VACUUM M CUP 4' TUBE (SUCTIONS)
GLOVE BIO SURGEON STRL SZ 6.5 (GLOVE) ×2 IMPLANT
GLOVE BIO SURGEONS STRL SZ 6.5 (GLOVE) ×1
GOWN STRL REUS W/TWL LRG LVL3 (GOWN DISPOSABLE) ×6 IMPLANT
KIT ABG SYR 3ML LUER SLIP (SYRINGE) IMPLANT
NEEDLE HYPO 25X5/8 SAFETYGLIDE (NEEDLE) IMPLANT
NS IRRIG 1000ML POUR BTL (IV SOLUTION) ×3 IMPLANT
PACK C SECTION WH (CUSTOM PROCEDURE TRAY) ×3 IMPLANT
PAD OB MATERNITY 4.3X12.25 (PERSONAL CARE ITEMS) ×3 IMPLANT
RTRCTR C-SECT PINK 25CM LRG (MISCELLANEOUS) ×3 IMPLANT
STAPLER VISISTAT 35W (STAPLE) IMPLANT
STRIP CLOSURE SKIN 1/2X4 (GAUZE/BANDAGES/DRESSINGS) ×2 IMPLANT
SUT MNCRL 0 VIOLET CTX 36 (SUTURE) ×2 IMPLANT
SUT MONOCRYL 0 CTX 36 (SUTURE) ×4
SUT PLAIN 1 NONE 54 (SUTURE) IMPLANT
SUT PLAIN 2 0 XLH (SUTURE) ×3 IMPLANT
SUT VIC AB 0 CT1 27 (SUTURE) ×4
SUT VIC AB 0 CT1 27XBRD ANBCTR (SUTURE) ×2 IMPLANT
SUT VIC AB 2-0 CT1 27 (SUTURE) ×2
SUT VIC AB 2-0 CT1 TAPERPNT 27 (SUTURE) ×1 IMPLANT
SUT VIC AB 4-0 KS 27 (SUTURE) ×3 IMPLANT
SWABSTICK BENZOIN STERILE (MISCELLANEOUS) ×3 IMPLANT
SYR BULB IRRIGATION 50ML (SYRINGE) ×3 IMPLANT
TOWEL OR 17X24 6PK STRL BLUE (TOWEL DISPOSABLE) ×3 IMPLANT
TRAY FOLEY BAG SILVER LF 16FR (CATHETERS) ×3 IMPLANT
TRAY FOLEY CATH 14FR (SET/KITS/TRAYS/PACK) IMPLANT
WATER STERILE IRR 1000ML POUR (IV SOLUTION) ×3 IMPLANT

## 2014-01-12 NOTE — Lactation Note (Signed)
This note was copied from the chart of Allison Konrad FelixMelissa Lembke. Lactation Consultation Note  P2, Breastfeed first child only 3-4 times and started supplementing due to blood sugar issues. Mother has baby STS after bath upon entering room. Attempted latching in cross cradle position but baby too sleepy.  7 hours old. Reviewed hand expression, cluster feeding, Baby & Me pp 20-24. Mom made aware of O/P services, breastfeeding support groups, community resources, and our phone # for post-discharge questions.    Patient Name: Allison Kane Today's Date: 01/12/2014 Reason for consult: Initial assessment   Maternal Data Has patient been taught Hand Expression?: Yes Does the patient have breastfeeding experience prior to this delivery?: Yes  Feeding Feeding Type: Breast Fed Length of feed: 3 min  LATCH Score/Interventions Latch: Repeated attempts needed to sustain latch, nipple held in mouth throughout feeding, stimulation needed to elicit sucking reflex. Intervention(s): Assist with latch  Audible Swallowing: None  Type of Nipple: Everted at rest and after stimulation  Comfort (Breast/Nipple): Soft / non-tender     Hold (Positioning): Assistance needed to correctly position infant at breast and maintain latch. Intervention(s): Breastfeeding basics reviewed;Position options  LATCH Score: 6  Lactation Tools Discussed/Used     Consult Status Consult Status: Follow-up Date: 01/13/14 Follow-up type: In-patient    Dahlia ByesBerkelhammer, Ruth [facility] 01/12/2014, 5:07 PM

## 2014-01-12 NOTE — Lactation Note (Signed)
This note was copied from the chart of Allison Konrad FelixMelissa Aubry. Lactation Consultation Note  Baby rooting and alert.  Attempted latching in cross cradle and football hold. Assessed baby's mouth.  Disorganized suck and some biting. Demonstrated how to compress breast to achieve a deep latch. Baby will not sustain latch.  Very fussy.  Burped baby and checked diaper. Hand expressed 2 ml of breastmilk easily and fed to baby. Encouraged STS. Mother will retry after she rests. Encouraged mother to keep trying.  Patient Name: Allison Kane ONGEX'BToday's Date: 01/12/2014 Reason for consult: Follow-up assessment   Maternal Data Has patient been taught Hand Expression?: Yes Does the patient have breastfeeding experience prior to this delivery?: Yes  Feeding    LATCH Score/Interventions                      Lactation Tools Discussed/Used     Consult Status Consult Status: Follow-up Date: 01/13/14 Follow-up type: In-patient    Dahlia ByesBerkelhammer, Ruth [facility] 01/12/2014, 6:57 PM

## 2014-01-12 NOTE — Anesthesia Preprocedure Evaluation (Signed)
Anesthesia Evaluation  Patient identified by MRN, date of birth, ID band Patient awake    Reviewed: Allergy & Precautions, H&P , NPO status , Patient's Chart, lab work & pertinent test results  Airway Mallampati: III  TM Distance: >3 FB Neck ROM: Full    Dental no notable dental hx. (+) Teeth Intact   Pulmonary asthma ,  breath sounds clear to auscultation  Pulmonary exam normal       Cardiovascular negative cardio ROS  Rhythm:Regular Rate:Normal     Neuro/Psych negative neurological ROS  negative psych ROS   GI/Hepatic Neg liver ROS, GERD-  ,  Endo/Other  Obesity  Renal/GU negative Renal ROS  negative genitourinary   Musculoskeletal negative musculoskeletal ROS (+)   Abdominal (+) + obese,   Peds  Hematology negative hematology ROS (+)   Anesthesia Other Findings   Reproductive/Obstetrics Previous C/Section                             Anesthesia Physical Anesthesia Plan  ASA: II  Anesthesia Plan: Spinal   Post-op Pain Management:    Induction:   Airway Management Planned: Natural Airway  Additional Equipment:   Intra-op Plan:   Post-operative Plan:   Informed Consent: I have reviewed the patients History and Physical, chart, labs and discussed the procedure including the risks, benefits and alternatives for the proposed anesthesia with the patient or authorized representative who has indicated his/her understanding and acceptance.     Plan Discussed with: Anesthesiologist, CRNA and Surgeon  Anesthesia Plan Comments:         Anesthesia Quick Evaluation

## 2014-01-12 NOTE — Plan of Care (Signed)
Problem: Phase I Progression Outcomes Goal: Foley catheter patent Outcome: Completed/Met Date Met:  01/12/14 Goal: IS, TCDB as ordered Outcome: Completed/Met Date Met:  01/12/14 Goal: Initial discharge plan identified Outcome: Completed/Met Date Met:  01/12/14

## 2014-01-12 NOTE — Plan of Care (Signed)
Problem: Phase I Progression Outcomes Goal: OOB as tolerated unless otherwise ordered Outcome: Adequate for Discharge     

## 2014-01-12 NOTE — Consult Note (Signed)
Neonatology Note:   Attendance at C-section:   I was asked by Dr. Bovard-Stuckert to attend this repeat C/S at term. The mother is a G2P1 A neg, GBS neg with an uncomplicated pregnancy (mother's H and P states that she had GDM, but Dr. Bovard says this is in error, no GDM present). ROM at delivery, fluid clear. Infant vigorous with good spontaneous cry and tone. Needed only minimal bulb suctioning. Ap 9/9. Lungs with few rales to ausc in DR. To CN to care of Pediatrician.  Ozzie Knobel C. Janit Cutter, MD 

## 2014-01-12 NOTE — Transfer of Care (Signed)
Immediate Anesthesia Transfer of Care Note  Patient: Allison FelixMelissa Kampa  Procedure(s) Performed: Procedure(s): CESAREAN SECTION (N/A)  Patient Location: PACU  Anesthesia Type:Spinal  Level of Consciousness: awake, alert  and oriented  Airway & Oxygen Therapy: Patient Spontanous Breathing  Post-op Assessment: Report given to PACU RN and Post -op Vital signs reviewed and stable  Post vital signs: Reviewed and stable  Complications: No apparent anesthesia complications

## 2014-01-12 NOTE — Interval H&P Note (Signed)
History and Physical Interval Note:  01/12/2014 8:26 AM  Allison FelixMelissa Kane  has presented today for surgery, with the diagnosis of Repeat C/Section  The various methods of treatment have been discussed with the patient and family. After consideration of risks, benefits and other options for treatment, the patient has consented to  Procedure(s): CESAREAN SECTION (N/A) as a surgical intervention .  The patient's history has been reviewed, patient examined, no change in status, stable for surgery.  I have reviewed the patient's chart and labs.  Questions were answered to the patient's satisfaction.    Pt declines BTL, unsure if done.  If decides, will do interval tubal.  Pt not GDM.    Bovard-Stuckert, Ly Bacchi

## 2014-01-12 NOTE — Anesthesia Procedure Notes (Signed)
Spinal Patient location during procedure: OR Preanesthetic Checklist Completed: patient identified, site marked, surgical consent, pre-op evaluation, timeout performed, IV checked, risks and benefits discussed and monitors and equipment checked Spinal Block Patient position: sitting Prep: site prepped and draped and DuraPrep Patient monitoring: heart rate, cardiac monitor, continuous pulse ox and blood pressure Approach: midline Location: L3-4 Injection technique: single-shot Needle Needle type: Sprotte  Needle gauge: 24 G Needle length: 9 cm Assessment Sensory level: T4 Additional Notes Spinal Dosage in OR  Bupivicaine ml       1.4 PFMS04   mcg        200 Fentanyl mcg            25

## 2014-01-12 NOTE — Brief Op Note (Signed)
01/12/2014  10:51 AM  PATIENT:  Allison Kane  28 y.o. female  PRE-OPERATIVE DIAGNOSIS:  Repeat Cesarean Section  POST-OPERATIVE DIAGNOSIS:  Repeat Cesarean Section  PROCEDURE:  Procedure(s): CESAREAN SECTION (N/A)  SURGEON:  Surgeon(s) and Role:    * Sherian ReinJody Bovard-Stuckert, MD - Primary    * Oliver PilaKathy W Richardson, MD - Assisting  ANESTHESIA:   spinal  FINDINGS: viable female infant at 9:40, apgars P, wt P; nl uterus, tubes and ovaries  EBL:  Total I/O In: 2200 [I.V.:2200] Out: 850 [Urine:150; Blood:700]  BLOOD ADMINISTERED:none  DRAINS: Urinary Catheter (Foley)   LOCAL MEDICATIONS USED:  NONE  SPECIMEN:  Source of Specimen:  Placenta  DISPOSITION OF SPECIMEN:  L&D  COUNTS:  YES  TOURNIQUET:  * No tourniquets in log *  DICTATION: .Other Dictation: Dictation Number [id number]  PLAN OF CARE: Admit to inpatient   PATIENT DISPOSITION:  PACU - hemodynamically stable.   Delay start of Pharmacological VTE agent (>24hrs) due to surgical blood loss or risk of bleeding: not applicable

## 2014-01-12 NOTE — Anesthesia Postprocedure Evaluation (Signed)
  Anesthesia Post-op Note  Patient: Allison FelixMelissa Reichelt  Procedure(s) Performed: Procedure(s): CESAREAN SECTION (N/A)   Patient is awake, responsive, moving her legs, and has signs of resolution of her numbness. Pain and nausea are reasonably well controlled. Vital signs are stable and clinically acceptable. Oxygen saturation is clinically acceptable. There are no apparent anesthetic complications at this time. Patient is ready for discharge.

## 2014-01-13 ENCOUNTER — Encounter (HOSPITAL_COMMUNITY): Payer: Self-pay | Admitting: Obstetrics and Gynecology

## 2014-01-13 LAB — CCBB MATERNAL DONOR DRAW

## 2014-01-13 LAB — TYPE AND SCREEN
ABO/RH(D): A NEG
Antibody Screen: POSITIVE
DAT, IgG: NEGATIVE
UNIT DIVISION: 0
Unit division: 0

## 2014-01-13 LAB — CBC
HCT: 33.1 % — ABNORMAL LOW (ref 36.0–46.0)
Hemoglobin: 11.3 g/dL — ABNORMAL LOW (ref 12.0–15.0)
MCH: 31.8 pg (ref 26.0–34.0)
MCHC: 34.1 g/dL (ref 30.0–36.0)
MCV: 93.2 fL (ref 78.0–100.0)
Platelets: 150 10*3/uL (ref 150–400)
RBC: 3.55 MIL/uL — ABNORMAL LOW (ref 3.87–5.11)
RDW: 14 % (ref 11.5–15.5)
WBC: 11.1 10*3/uL — ABNORMAL HIGH (ref 4.0–10.5)

## 2014-01-13 LAB — BIRTH TISSUE RECOVERY COLLECTION (PLACENTA DONATION)

## 2014-01-13 MED ORDER — RHO D IMMUNE GLOBULIN 1500 UNIT/2ML IJ SOSY
300.0000 ug | PREFILLED_SYRINGE | Freq: Once | INTRAMUSCULAR | Status: AC
  Administered 2014-01-13: 300 ug via INTRAVENOUS
  Filled 2014-01-13: qty 2

## 2014-01-13 NOTE — Lactation Note (Signed)
This note was copied from the chart of Allison Konrad FelixMelissa Babich. Lactation Consultation Note Mom has changed to formula bottle feeding. Stated its to hard on her and she had difficulty with her first child and it didn't work and no longer wishes to try. Information dicussed about engorgement. Reminded of LC services if she has any questions. Patient Name: Allison Kane ZOXWR'UToday's Date: 01/13/2014 Reason for consult: Follow-up assessment   Maternal Data    Feeding    LATCH Score/Interventions                      Lactation Tools Discussed/Used     Consult Status Consult Status: Complete Date: 01/13/14 Follow-up type: Call as needed    [name], Diamond NickelLAURA G 01/13/2014, 2:27 PM

## 2014-01-13 NOTE — Addendum Note (Signed)
Addendum  created 01/13/14 0804 by Earmon PhoenixValerie P Leray Garverick, CRNA   Modules edited: Notes Section   Notes Section:  File: [id number]

## 2014-01-13 NOTE — Op Note (Signed)
NAME:  Allison Kane, Allison Kane             ACCOUNT NO.:  [id number]  MEDICAL RECORD NO.:  [id number]  LOCATION:  9148                          FACILITY:  [facility]  PHYSICIAN:  Sherron MondayJody Bovard, MD        DATE OF BIRTH:  1985-04-06  DATE OF PROCEDURE:  01/12/2014 DATE OF DISCHARGE:                              OPERATIVE REPORT   PREOPERATIVE DIAGNOSES:  Intrauterine pregnancy at term, declines vaginal birth after cesarean, for repeat low-transverse cesarean section.  POSTOPERATIVE DIAGNOSES:  Intrauterine pregnancy at term, declines vaginal birth after cesarean, for repeat low-transverse cesarean section.  PROCEDURE:  Repeat low-transverse cesarean section.  SURGEON:  Sherron MondayJody Bovard, MD  ASSISTANT:  Huel CoteKathy Richardson, M.D.  ANESTHESIA:  Spinal.  EBL:  700 mL.  URINE OUTPUT:  150 mL clear urine at the end of the procedure.  IV FLUIDS:  2200 mL.  COMPLICATIONS:  None.  PATHOLOGY:  Placenta to cord blood collection.  DESCRIPTION OF PROCEDURE:  After informed consent was reviewed with the patient and her spouse, she was transported to the operating room where spinal anesthesia was placed, found to be adequate.  She was then placed on the table in a supine position with a leftward tilt.  Prepped and draped in the normal sterile fashion.  A Pfannenstiel skin incision was made at the level of her previous incision, carried through the underlying layer of fascia sharply.  The fascia was incised in the midline.  The incision was extended laterally with Mayo scissors.  The superior aspect of the fascial incision was grasped with Kocher clamps and elevated.  The rectus muscles were dissected off both bluntly and sharply.  Attention was turned to the inferior portion in a similar fashion, grasped with Kocher clamps and elevated, the rectus muscles were dissected off both bluntly and sharply.  Peritoneum was entered sharply.  Some omental to peritoneal adhesions were noted.  The Alexis skin retractor  was placed carefully making sure no bowel was entrapped. Uterus was inspected, infant was in a vertex presentation.  Uterus was incised in a transverse fashion.  Infant was delivered from vertex presentation.  Cord was clamped and cut.  Mouth was suctioned.  The infant was handed off to the awaiting pediatric staff.  A cervical extension was noted on the left, this was closed with the hysterotomy incision with 0-Monocryl in 2 layers, first of which is running locked and second is an imbricating layer, noted to be hemostatic.  The tubes and ovaries were inspected and found to be normal.  The Alexis retractor was removed.  The peritoneum was reapproximated with 2-0 Vicryl. Subfascial planes were inspected and found to be hemostatic.  The fascia was reapproximated with 0-Vicryl from either corner overlapping in the midline, thus particular adipose layer was made to be hemostatic with Bovie cautery.  The 3-0 plain gut was used to close the dead space.  The skin was closed with 4-0 Vicryl on a Keith needle.  Benzoin and Steri- Strips were applied.  The patient tolerated the procedure well.  Sponge, lap, and needle counts were correct x2 per the operating room staff.     Sherron MondayJody Bovard, MD     JB/MEDQ  D:  01/12/2014  T:  01/12/2014  Job:  [id number]

## 2014-01-13 NOTE — Anesthesia Postprocedure Evaluation (Signed)
  Anesthesia Post-op Note  Patient: Allison FelixMelissa Bastyr  Procedure(s) Performed: Procedure(s): CESAREAN SECTION (N/A)  Patient Location: Mother/Baby  Anesthesia Type:Spinal  Level of Consciousness: awake, alert , oriented and patient cooperative  Airway and Oxygen Therapy: Patient Spontanous Breathing  Post-op Pain: mild  Post-op Assessment: Patient's Cardiovascular Status Stable, Respiratory Function Stable, NAUSEA AND VOMITING PRESENT, Pain level controlled, Pain level not controlled, No headache, No backache, No residual numbness and No residual motor weakness  Post-op Vital Signs: stable  Last Vitals:  Filed Vitals:   01/13/14 0530  BP: 103/57  Pulse: 53  Temp: 36.5 C  Resp: 18    Complications: No apparent anesthesia complications

## 2014-01-13 NOTE — Plan of Care (Signed)
Problem: Phase I Progression Outcomes Goal: Pain controlled with appropriate interventions Outcome: Completed/Met Date Met:  01/13/14 Goal: OOB as tolerated unless otherwise ordered Outcome: Completed/Met Date Met:  01/13/14 Goal: VS, stable, temp < 100.4 degrees F Outcome: Completed/Met Date Met:  01/13/14

## 2014-01-13 NOTE — Progress Notes (Signed)
Subjective: Postpartum Day 1: Cesarean Delivery Patient reports incisional pain and tolerating PO.  Pain controlled, nl lochia  Objective: Vital signs in last 24 hours: Temp:  [97.4 F (36.3 C)-98.3 F (36.8 C)] 97.7 F (36.5 C) (12/01 0530) Pulse Rate:  [50-78] 53 (12/01 0530) Resp:  [13-25] 18 (12/01 0530) BP: (90-115)/(41-71) 103/57 mmHg (12/01 0530) SpO2:  [94 %-99 %] 96 % (12/01 0530)  Physical Exam:  General: alert and no distress Lochia: appropriate Uterine Fundus: firm Incision: healing well DVT Evaluation: No evidence of DVT seen on physical exam.   Recent Labs  01/13/14 0600  HGB 11.3*  HCT 33.1*    Assessment/Plan: Status post Cesarean section. Doing well postoperatively.  Continue current care.  Kane, Allison Andel 01/13/2014, 7:33 AM

## 2014-01-13 NOTE — Plan of Care (Signed)
Problem: Phase I Progression Outcomes Goal: Voiding adequately Outcome: Completed/Met Date Met:  01/13/14  Problem: Phase II Progression Outcomes Goal: Pain controlled on oral analgesia Outcome: Completed/Met Date Met:  01/13/14 Goal: Progress activity as tolerated unless otherwise ordered Outcome: Completed/Met Date Met:  01/13/14 Goal: Afebrile, VS remain stable Outcome: Completed/Met Date Met:  01/13/14 Goal: Incision intact & without signs/symptoms of infection Outcome: Completed/Met Date Met:  01/13/14 Goal: Rh isoimmunization per orders Outcome: Completed/Met Date Met:  01/13/14 Goal: Tolerating diet Outcome: Completed/Met Date Met:  01/13/14

## 2014-01-14 LAB — RH IG WORKUP (INCLUDES ABO/RH)
ABO/RH(D): A NEG
Antibody Screen: POSITIVE
DAT, IgG: NEGATIVE
FETAL SCREEN: NEGATIVE
Gestational Age(Wks): 39.1
Unit division: 0

## 2014-01-14 MED ORDER — IBUPROFEN 800 MG PO TABS: 800.0000 mg | ORAL_TABLET | Freq: Three times a day (TID) | ORAL | Status: DC

## 2014-01-14 MED ORDER — PRENATAL MULTIVITAMIN CH: 1.0000 | ORAL_TABLET | Freq: Every day | ORAL | Status: DC

## 2014-01-14 MED ORDER — OXYCODONE-ACETAMINOPHEN 5-325 MG PO TABS: 1.0000 | ORAL_TABLET | Freq: Four times a day (QID) | ORAL | Status: DC | PRN

## 2014-01-14 NOTE — Discharge Summary (Signed)
Obstetric Discharge Summary Reason for Admission: cesarean section Prenatal Procedures: none Intrapartum Procedures: cesarean: low cervical, transverse Postpartum Procedures: none Complications-Operative and Postpartum: none HEMOGLOBIN  Date Value Ref Range Status  01/13/2014 11.3* 12.0 - 15.0 g/dL Final   HCT  Date Value Ref Range Status  01/13/2014 33.1* 36.0 - 46.0 % Final    Physical Exam:  General: alert and no distress Lochia: appropriate Uterine Fundus: firm Incision: healing well DVT Evaluation: No evidence of DVT seen on physical exam.  Discharge Diagnoses: Term Pregnancy-delivered  Discharge Information: Date: 01/14/2014 Activity: pelvic rest Diet: routine Medications: PNV, Ibuprofen and Percocet Condition: stable Instructions: refer to practice specific booklet Discharge to: home Follow-up Information    Follow up with Bovard-Stuckert, Allison Antosh, MD. Schedule an appointment as soon as possible for a visit in 2 weeks.   Specialty:  Obstetrics and Gynecology   Why:  for incision check, 6 wks for full postpartum check   Contact information:   [address] [place] [id number] [phone]       Newborn Data: Live born female  Birth Weight: 7 lb 8.5 oz (3415 g) APGAR: 9, 9  Home with mother.  Bovard-Stuckert, Allison Kane 01/14/2014, 9:26 AM

## 2014-01-14 NOTE — Progress Notes (Signed)
Subjective: Postpartum Day 2: Cesarean Delivery Patient reports incisional pain and tolerating PO.  Nl lochia, pain controlled Objective: Vital signs in last 24 hours: Temp:  [96.8 F (36 C)-98.3 F (36.8 C)] 98.2 F (36.8 C) (12/02 0625) Pulse Rate:  [52-71] 52 (12/02 0625) Resp:  [18] 18 (12/02 0625) BP: (99-116)/(49-63) 99/49 mmHg (12/02 0625) SpO2:  [97 %] 97 % (12/01 1746) Weight:  [83.462 kg (184 lb)] 83.462 kg (184 lb) (12/01 2230)  Physical Exam:  General: alert and no distress Lochia: appropriate Uterine Fundus: firm Incision: healing well DVT Evaluation: No evidence of DVT seen on physical exam.   Recent Labs  01/13/14 0600  HGB 11.3*  HCT 33.1*    Assessment/Plan: Status post Cesarean section. Doing well postoperatively.  Continue current care.  Pt desires d/c to home - d/c with Motrin/percocet an dPNV, f/u 2 weeks for incision check, 6 wk for PP.    Bovard-Stuckert, Eleshia Wooley 01/14/2014, 7:41 AM

## 2014-01-14 NOTE — Plan of Care (Signed)
Problem: Consults Goal: Postpartum Patient Education (See Patient Education module for education specifics.) Outcome: Completed/Met Date Met:  01/14/14     

## 2014-01-14 NOTE — Plan of Care (Signed)
Problem: Discharge Progression Outcomes Goal: Barriers To Progression Addressed/Resolved Outcome: Completed/Met Date Met:  01/14/14 Goal: Activity appropriate for discharge plan Outcome: Completed/Met Date Met:  01/14/14 Goal: Tolerating diet Outcome: Completed/Met Date Met:  83/15/17 Goal: Complications resolved/controlled Outcome: Completed/Met Date Met:  01/14/14 Goal: Pain controlled with appropriate interventions Outcome: Completed/Met Date Met:  01/14/14 Goal: Afebrile, VS remain stable at discharge Outcome: Completed/Met Date Met:  01/14/14 Goal: Discharge plan in place and appropriate Outcome: Completed/Met Date Met:  01/14/14

## 2014-07-08 ENCOUNTER — Other Ambulatory Visit: Payer: Self-pay | Admitting: Family Medicine

## 2014-07-08 ENCOUNTER — Ambulatory Visit
Admission: RE | Admit: 2014-07-08 | Discharge: 2014-07-08 | Disposition: A | Discharge status: Home / Self Care | Payer: PRIVATE HEALTH INSURANCE | Source: Ambulatory Visit | Attending: Family Medicine | Admitting: Family Medicine

## 2014-07-08 DIAGNOSIS — R0981 Nasal congestion: Secondary | ICD-10-CM

## 2015-03-23 DIAGNOSIS — K219 Gastro-esophageal reflux disease without esophagitis: Secondary | ICD-10-CM | POA: Insufficient documentation

## 2015-03-23 DIAGNOSIS — J45909 Unspecified asthma, uncomplicated: Secondary | ICD-10-CM | POA: Insufficient documentation

## 2015-03-23 DIAGNOSIS — K589 Irritable bowel syndrome without diarrhea: Secondary | ICD-10-CM | POA: Insufficient documentation

## 2015-05-04 DIAGNOSIS — J342 Deviated nasal septum: Secondary | ICD-10-CM | POA: Insufficient documentation

## 2016-07-27 ENCOUNTER — Other Ambulatory Visit: Payer: Self-pay | Admitting: Physician Assistant

## 2016-07-27 DIAGNOSIS — R102 Pelvic and perineal pain: Secondary | ICD-10-CM

## 2016-07-27 DIAGNOSIS — R1084 Generalized abdominal pain: Secondary | ICD-10-CM

## 2016-08-01 ENCOUNTER — Ambulatory Visit
Admission: RE | Admit: 2016-08-01 | Discharge: 2016-08-01 | Disposition: A | Discharge status: Home / Self Care | Payer: 59 | Source: Ambulatory Visit | Attending: Physician Assistant | Admitting: Physician Assistant

## 2016-08-01 ENCOUNTER — Other Ambulatory Visit: Payer: PRIVATE HEALTH INSURANCE

## 2016-08-01 ENCOUNTER — Ambulatory Visit
Admission: RE | Admit: 2016-08-01 | Discharge: 2016-08-01 | Disposition: A | Discharge status: Home / Self Care | Payer: PRIVATE HEALTH INSURANCE | Source: Ambulatory Visit | Attending: Physician Assistant | Admitting: Physician Assistant

## 2016-08-01 DIAGNOSIS — R102 Pelvic and perineal pain: Secondary | ICD-10-CM

## 2016-08-01 DIAGNOSIS — R1084 Generalized abdominal pain: Secondary | ICD-10-CM

## 2017-05-04 ENCOUNTER — Ambulatory Visit (INDEPENDENT_AMBULATORY_CARE_PROVIDER_SITE_OTHER): Payer: 59 | Admitting: Physician Assistant

## 2017-05-04 ENCOUNTER — Encounter: Payer: Self-pay | Admitting: Physician Assistant

## 2017-05-04 ENCOUNTER — Other Ambulatory Visit: Payer: Self-pay

## 2017-05-04 ENCOUNTER — Ambulatory Visit (INDEPENDENT_AMBULATORY_CARE_PROVIDER_SITE_OTHER): Payer: 59

## 2017-05-04 VITALS — BP 110/74 | HR 79 | Temp 98.1°F | Resp 16 | Ht 62.75 in | Wt 165.4 lb

## 2017-05-04 DIAGNOSIS — Z8742 Personal history of other diseases of the female genital tract: Secondary | ICD-10-CM | POA: Insufficient documentation

## 2017-05-04 DIAGNOSIS — G8929 Other chronic pain: Secondary | ICD-10-CM | POA: Diagnosis not present

## 2017-05-04 DIAGNOSIS — R5383 Other fatigue: Secondary | ICD-10-CM | POA: Diagnosis not present

## 2017-05-04 DIAGNOSIS — M5440 Lumbago with sciatica, unspecified side: Secondary | ICD-10-CM

## 2017-05-04 DIAGNOSIS — W57XXXS Bitten or stung by nonvenomous insect and other nonvenomous arthropods, sequela: Secondary | ICD-10-CM

## 2017-05-04 LAB — CBC WITH DIFFERENTIAL/PLATELET
Basophils Absolute: 0.1 10*3/uL (ref 0.0–0.1)
Basophils Relative: 1 % (ref 0.0–3.0)
EOS ABS: 0.3 10*3/uL (ref 0.0–0.7)
Eosinophils Relative: 4.5 % (ref 0.0–5.0)
HEMATOCRIT: 40.2 % (ref 36.0–46.0)
HEMOGLOBIN: 13.5 g/dL (ref 12.0–15.0)
LYMPHS PCT: 21.7 % (ref 12.0–46.0)
Lymphs Abs: 1.6 10*3/uL (ref 0.7–4.0)
MCHC: 33.7 g/dL (ref 30.0–36.0)
MCV: 93.7 fl (ref 78.0–100.0)
MONO ABS: 0.6 10*3/uL (ref 0.1–1.0)
Monocytes Relative: 8.3 % (ref 3.0–12.0)
Neutro Abs: 4.8 10*3/uL (ref 1.4–7.7)
Neutrophils Relative %: 64.5 % (ref 43.0–77.0)
Platelets: 218 10*3/uL (ref 150.0–400.0)
RBC: 4.29 Mil/uL (ref 3.87–5.11)
RDW: 12.7 % (ref 11.5–15.5)
WBC: 7.4 10*3/uL (ref 4.0–10.5)

## 2017-05-04 LAB — COMPREHENSIVE METABOLIC PANEL
ALBUMIN: 4.2 g/dL (ref 3.5–5.2)
ALT: 12 U/L (ref 0–35)
AST: 16 U/L (ref 0–37)
Alkaline Phosphatase: 59 U/L (ref 39–117)
BUN: 10 mg/dL (ref 6–23)
CALCIUM: 9.2 mg/dL (ref 8.4–10.5)
CO2: 27 mEq/L (ref 19–32)
Chloride: 102 mEq/L (ref 96–112)
Creatinine, Ser: 0.8 mg/dL (ref 0.40–1.20)
GFR: 88.22 mL/min (ref >60.00)
Glucose, Bld: 93 mg/dL (ref 70–99)
Potassium: 4.4 mEq/L (ref 3.5–5.1)
SODIUM: 138 meq/L (ref 135–145)
Total Bilirubin: 0.3 mg/dL (ref 0.2–1.2)
Total Protein: 7 g/dL (ref 6.0–8.3)

## 2017-05-04 LAB — T4, FREE: Free T4: 0.84 ng/dL (ref 0.60–1.60)

## 2017-05-04 LAB — VITAMIN B12: Vitamin B-12: 638 pg/mL (ref 211–911)

## 2017-05-04 LAB — VITAMIN D 25 HYDROXY (VIT D DEFICIENCY, FRACTURES): VITD: 32.97 ng/mL (ref 30.00–100.00)

## 2017-05-04 LAB — SEDIMENTATION RATE: Sed Rate: 12 mm/hr (ref 0–20)

## 2017-05-04 LAB — TSH: TSH: 1.32 u[IU]/mL (ref 0.35–4.50)

## 2017-05-04 MED ORDER — GABAPENTIN 100 MG PO CAPS: ORAL_CAPSULE | ORAL | 1 refills | Status: DC

## 2017-05-04 NOTE — Patient Instructions (Signed)
Please go to the lab today for blood work.  I will call you with your results. We will alter treatment regimen(s) if indicated by your results.   Please resume your gabapentin twice daily for chronic back pain. [facility] at [facility] [place]. This is done as a walk-in.   Follow-up will be based on lab and imaging results.

## 2017-05-06 NOTE — Progress Notes (Signed)
Patient presents to clinic today to establish care.  Patient endorses long standing fatigue over the past year, worsening over the past 2 months. Notes associated weight gain of about 20 pounds. Denies constipation but notes skin dryness. Denies cold intolerance. Is working hard on diet and exercise without improvement in weight. Patient also with chronic back pain over the past several years, diagnosed with sciatica. States she was on Gabapentin 100 mg BID to help with chronic pain but has run out. Endorses she has never had imaging of lower back. Of note, she does note a tick bite a couple of years prior that caused her to feel feverish, fatigued and noted a rash. Denies ever having testing or treatment for this.   Patient also notes a small bump underneath the skin, just below her R gluteal region. Denies pain, itching or discharge. Would like assessed today.   Past Medical History:  Diagnosis Date  . Allergy   . Asthma   . Frequent headaches   . GERD (gastroesophageal reflux disease)   . IBS (irritable bowel syndrome)   . Migraines     Past Surgical History:  Procedure Laterality Date  . CERVICAL BIOPSY  W/ LOOP ELECTRODE EXCISION  2011   HGSIL  . CESAREAN SECTION     2013  . CESAREAN SECTION N/A 01/12/2014   Procedure: CESAREAN SECTION;  Surgeon: Sherian Rein, MD;  Location: [facility];  Service: Obstetrics;  Laterality: N/A;    Current Outpatient Medications on File Prior to Visit  Medication Sig Dispense Refill  . Acetaminophen (TYLENOL PO) Take 1,000 mg by mouth 2 (two) times daily as needed (for headaches).     . Ascorbic Acid (VITAMIN C) 100 MG tablet Take by mouth.    . Cholecalciferol (VITAMIN D3) 5000 units CAPS Take 1 capsule by mouth daily.    Marland Kitchen ibuprofen (ADVIL,MOTRIN) 800 MG tablet Take 1 tablet (800 mg total) by mouth every 8 (eight) hours. 45 tablet 1  . Norethin Ace-Eth Estrad-FE (NORETHINDRONE ACET-ETHINYL EST) 1-20 MG-MCG(24) CHEW norethindrone acetate 1  mg-ethinyl estradiol 20 mcg tablet    . tretinoin (RETIN-A) 0.05 % cream tretinoin 0.05 % topical cream    . Vitamin D, Ergocalciferol, (DRISDOL) 50000 units CAPS capsule ergocalciferol (vitamin D2) 50,000 unit capsule  TK 1 C PO Q WEEK     No current facility-administered medications on file prior to visit.     Allergies  Allergen Reactions  . Peanut-Containing Drug Products     Allergic to all nuts  . Latex Swelling  . Penicillins Other (See Comments)    Reaction:  High fever    Family History  Problem Relation Age of Onset  . Cancer Maternal Aunt   . Osteoporosis Maternal Grandmother   . Arthritis Maternal Grandmother   . Birth defects Maternal Grandmother   . Cancer Maternal Grandmother   . Hyperlipidemia Maternal Grandmother   . Hyperlipidemia Mother   . Diabetes Father   . Hyperlipidemia Father   . Hyperlipidemia Brother   . Arthritis Paternal Grandmother   . Asthma Paternal Grandmother   . Cancer Paternal Grandmother   . Hearing loss Paternal Grandmother   . Hyperlipidemia Paternal Grandmother   . Hypertension Paternal Grandmother     Social History   Socioeconomic History  . Marital status: Married    Spouse name: Not on file  . Number of children: Not on file  . Years of education: Not on file  . Highest education level: Not on file  Occupational History  . Not on file  Social Needs  . Financial resource strain: Not on file  . Food insecurity:    Worry: Not on file    Inability: Not on file  . Transportation needs:    Medical: Not on file    Non-medical: Not on file  Tobacco Use  . Smoking status: Former Smoker    Last attempt to quit: 10/13/2011    Years since quitting: 5.5  . Smokeless tobacco: Never Used  Substance and Sexual Activity  . Alcohol use: Yes    Comment: 5 drinks/month  . Drug use: No  . Sexual activity: Yes    Partners: Male    Birth control/protection: None  Lifestyle  . Physical activity:    Days per week: Not on file     Minutes per session: Not on file  . Stress: Not on file  Relationships  . Social connections:    Talks on phone: Not on file    Gets together: Not on file    Attends religious service: Not on file    Active member of club or organization: Not on file    Attends meetings of clubs or organizations: Not on file    Relationship status: Not on file  . Intimate partner violence:    Fear of current or ex partner: Not on file    Emotionally abused: Not on file    Physically abused: Not on file    Forced sexual activity: Not on file  Other Topics Concern  . Not on file  Social History Narrative  . Not on file   Review of Systems  Constitutional: Positive for malaise/fatigue. Negative for fever and weight loss.  HENT: Negative for ear discharge, ear pain, hearing loss and tinnitus.   Eyes: Negative for blurred vision, double vision, photophobia and pain.  Respiratory: Negative for cough and shortness of breath.   Cardiovascular: Negative for chest pain and palpitations.  Gastrointestinal: Negative for abdominal pain, blood in stool, constipation, diarrhea, heartburn, melena, nausea and vomiting.  Genitourinary: Negative for dysuria, flank pain, frequency, hematuria and urgency.  Musculoskeletal: Positive for back pain. Negative for falls.  Neurological: Negative for dizziness, loss of consciousness and headaches.  Endo/Heme/Allergies: Negative for environmental allergies.  Psychiatric/Behavioral: Negative for depression, hallucinations, substance abuse and suicidal ideas. The patient is not nervous/anxious and does not have insomnia.    BP 110/74   Pulse 79   Temp 98.1 F (36.7 C) (Oral)   Resp 16   Ht 5' 2.75" (1.594 m)   Wt 165 lb 6.4 oz (75 kg)   LMP 05/04/2016   SpO2 98%   BMI 29.53 kg/m   Physical Exam  Constitutional: She is oriented to person, place, and time and well-developed, well-nourished, and in no distress.  HENT:  Head: Normocephalic and atraumatic.  Right Ear:  External ear normal.  Left Ear: External ear normal.  Nose: Nose normal.  Mouth/Throat: Oropharynx is clear and moist. No oropharyngeal exudate.  TM within normal limits.   Eyes: Conjunctivae are normal.  Neck: Neck supple. No thyromegaly present.  Cardiovascular: Normal rate, regular rhythm, normal heart sounds and intact distal pulses.  Pulmonary/Chest: Effort normal and breath sounds normal. No respiratory distress. She has no wheezes. She has no rales. She exhibits no tenderness.  Abdominal: Soft. Bowel sounds are normal. She exhibits no distension and no mass. There is no tenderness. There is no rebound and no guarding.  Neurological: She is alert and oriented to person,  place, and time.  Skin: Skin is warm and dry. No rash noted.     Psychiatric: Affect normal.  Vitals reviewed.  Recent Results (from the past 2160 hour(s))  CBC with Differential/Platelet     Status: None   Collection Time: 05/04/17  9:49 AM  Result Value Ref Range   WBC 7.4 4.0 - 10.5 K/uL   RBC 4.29 3.87 - 5.11 Mil/uL   Hemoglobin 13.5 12.0 - 15.0 g/dL   HCT 16.140.2 09.636.0 - 04.546.0 %   MCV 93.7 78.0 - 100.0 fl   MCHC 33.7 30.0 - 36.0 g/dL   RDW 40.912.7 81.111.5 - 91.415.5 %   Platelets 218.0 150.0 - 400.0 K/uL   Neutrophils Relative % 64.5 43.0 - 77.0 %   Lymphocytes Relative 21.7 12.0 - 46.0 %   Monocytes Relative 8.3 3.0 - 12.0 %   Eosinophils Relative 4.5 0.0 - 5.0 %   Basophils Relative 1.0 0.0 - 3.0 %   Neutro Abs 4.8 1.4 - 7.7 K/uL   Lymphs Abs 1.6 0.7 - 4.0 K/uL   Monocytes Absolute 0.6 0.1 - 1.0 K/uL   Eosinophils Absolute 0.3 0.0 - 0.7 K/uL   Basophils Absolute 0.1 0.0 - 0.1 K/uL  Comprehensive metabolic panel     Status: None   Collection Time: 05/04/17  9:49 AM  Result Value Ref Range   Sodium 138 135 - 145 mEq/L   Potassium 4.4 3.5 - 5.1 mEq/L   Chloride 102 96 - 112 mEq/L   CO2 27 19 - 32 mEq/L   Glucose, Bld 93 70 - 99 mg/dL   BUN 10 6 - 23 mg/dL   Creatinine, Ser 7.820.80 0.40 - 1.20 mg/dL   Total  Bilirubin 0.3 0.2 - 1.2 mg/dL   Alkaline Phosphatase 59 39 - 117 U/L   AST 16 0 - 37 U/L   ALT 12 0 - 35 U/L   Total Protein 7.0 6.0 - 8.3 g/dL   Albumin 4.2 3.5 - 5.2 g/dL   Calcium 9.2 8.4 - 95.610.5 mg/dL   GFR 21.3088.22 >86.57>60.00 mL/min  TSH     Status: None   Collection Time: 05/04/17  9:49 AM  Result Value Ref Range   TSH 1.32 0.35 - 4.50 uIU/mL  Sedimentation rate     Status: None   Collection Time: 05/04/17  9:49 AM  Result Value Ref Range   Sed Rate 12 0 - 20 mm/hr  Vitamin D (25 hydroxy)     Status: None   Collection Time: 05/04/17  9:49 AM  Result Value Ref Range   VITD 32.97 30.00 - 100.00 ng/mL  B12     Status: None   Collection Time: 05/04/17  9:49 AM  Result Value Ref Range   Vitamin B-12 638 211 - 911 pg/mL  T4, free     Status: None   Collection Time: 05/04/17  9:49 AM  Result Value Ref Range   Free T4 0.84 0.60 - 1.60 ng/dL    Comment: Specimens from patients who are undergoing biotin therapy and /or ingesting biotin supplements may contain high levels of biotin.  The higher biotin concentration in these specimens interferes with this Free T4 assay.  Specimens that contain high levels  of biotin may cause false high results for this Free T4 assay.  Please interpret results in light of the total clinical presentation of the patient.    B. burgdorfi antibodies     Status: None   Collection Time: 05/04/17  9:51 AM  Result Value Ref Range   B burgdorferi Ab IgG+IgM <0.90 index    Comment:                    Index                Interpretation                    -----                --------------                    < 0.90               Negative                    0.90-1.09            Equivocal                    > 1.09               Positive . As recommended by the Food and Drug Administration  (FDA), all samples with positive or equivocal  results in a Borrelia burgdorferi antibody screen will be tested using a blot method. Positive or  equivocal screening test  results should not be  interpreted as truly positive until verified as such  using a supplemental assay (e.g., B. burgdorferi blot). . The screening test and/or blot for B. burgdorferi  antibodies may be falsely negative in early stages of Lyme disease, including the period when erythema  migrans is apparent. .   Thyroid peroxidase antibody     Status: None   Collection Time: 05/04/17  9:51 AM  Result Value Ref Range   Thyroperoxidase Ab SerPl-aCnc <1 <9 IU/mL   Assessment/Plan: 1. Fatigue, unspecified type Labs to further assess giving uncertain etiology. Exam unremarkable for potential cause.  - CBC with Differential/Platelet - Comprehensive metabolic panel - TSH - Sedimentation rate - Vitamin D (25 hydroxy) - B12 - T4, free - B. burgdorfi antibodies - Thyroid peroxidase antibody  2. Tick bite, sequela Will check lyme antibodies giving history and chronic fatigue. - B. burgdorfi antibodies - Thyroid peroxidase antibody  3. Chronic bilateral low back pain with sciatica, sciatica laterality unspecified X-ray of lumbar spine for initial imaging. May need MRI. Restart Gabapentin 100 mg BID. Follow-up discussed.  - DG Lumbar Spine Complete; Future    Piedad Climes, PA-C

## 2017-05-07 ENCOUNTER — Other Ambulatory Visit: Payer: Self-pay | Admitting: Physician Assistant

## 2017-05-07 DIAGNOSIS — M5442 Lumbago with sciatica, left side: Principal | ICD-10-CM

## 2017-05-07 DIAGNOSIS — G8929 Other chronic pain: Secondary | ICD-10-CM

## 2017-05-07 DIAGNOSIS — M5441 Lumbago with sciatica, right side: Principal | ICD-10-CM

## 2017-05-07 LAB — THYROID PEROXIDASE ANTIBODY: Thyroperoxidase Ab SerPl-aCnc: <1 IU/mL (ref <9)

## 2017-05-07 LAB — B. BURGDORFI ANTIBODIES: B burgdorferi Ab IgG+IgM: <0.9 index

## 2017-05-07 MED ORDER — ALBUTEROL SULFATE HFA 108 (90 BASE) MCG/ACT IN AERS: 2.0000 | INHALATION_SPRAY | Freq: Four times a day (QID) | RESPIRATORY_TRACT | 1 refills | Status: DC | PRN

## 2017-05-07 NOTE — Progress Notes (Signed)
Al

## 2017-05-08 ENCOUNTER — Telehealth: Payer: Self-pay | Admitting: *Deleted

## 2017-05-08 DIAGNOSIS — R0681 Apnea, not elsewhere classified: Secondary | ICD-10-CM

## 2017-05-08 DIAGNOSIS — Z72820 Sleep deprivation: Secondary | ICD-10-CM

## 2017-05-08 NOTE — Addendum Note (Signed)
Addended by: Waldon MerlMARTIN, Nasario Czerniak C on: 05/08/2017 01:16 PM   Modules accepted: Orders

## 2017-05-08 NOTE — Telephone Encounter (Signed)
Copied from CRM [id number]. Topic: Referral - Request >> May 08, 2017 11:59 AM Oneal GroutSebastian, Jennifer S wrote: Reason for CRM: Patient would like to go ahead and move forward with sleep study.

## 2017-05-08 NOTE — Telephone Encounter (Signed)
Order has been placed. She will be contacted for the home sleep study.

## 2017-05-15 ENCOUNTER — Encounter (INDEPENDENT_AMBULATORY_CARE_PROVIDER_SITE_OTHER): Payer: Self-pay | Admitting: Orthopaedic Surgery

## 2017-05-15 ENCOUNTER — Ambulatory Visit (INDEPENDENT_AMBULATORY_CARE_PROVIDER_SITE_OTHER): Payer: 59 | Admitting: Orthopaedic Surgery

## 2017-05-15 VITALS — BP 109/77 | HR 74 | Resp 16 | Ht 63.0 in | Wt 162.0 lb

## 2017-05-15 DIAGNOSIS — M5441 Lumbago with sciatica, right side: Secondary | ICD-10-CM | POA: Diagnosis not present

## 2017-05-15 DIAGNOSIS — M5442 Lumbago with sciatica, left side: Secondary | ICD-10-CM | POA: Diagnosis not present

## 2017-05-15 DIAGNOSIS — G8929 Other chronic pain: Secondary | ICD-10-CM
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
## 2017-05-24 ENCOUNTER — Ambulatory Visit: Payer: 59 | Attending: Physician Assistant | Admitting: Neurology

## 2017-05-24 DIAGNOSIS — R0681 Apnea, not elsewhere classified: Secondary | ICD-10-CM | POA: Diagnosis present

## 2017-05-24 DIAGNOSIS — Z72820 Sleep deprivation: Secondary | ICD-10-CM | POA: Insufficient documentation

## 2017-05-24 DIAGNOSIS — Z79899 Other long term (current) drug therapy: Secondary | ICD-10-CM | POA: Insufficient documentation

## 2017-05-27 NOTE — Procedures (Signed)
   [facility] Manases [place]ch[place] A. Gerilyn Pilgrimoonquah, MD     [email or web address]             HOME SLEEP STUDY  LOCATION: [facility]  Patient Name: Konrad FelixSanders, Evamarie Study Date: 05/24/2017 Gender: Female D.O.B: 1985-08-09 Age (years): 31 Referring Provider: Waldon MerlWilliam C Martin PA-C Height (inches): 63 Interpreting Physician: Beryle BeamsKofi Dustina Scoggin MD, ABSM Weight (lbs): 165 RPSGT: Peak, Robert BMI: 29 MRN: [id number] Neck Size: 14.00 CLINICAL INFORMATION Sleep Study Type: HST     Indication for sleep study: N/A     Epworth Sleepiness Score: NA  SLEEP STUDY TECHNIQUE A multi-channel overnight portable sleep study was performed. The channels recorded were: nasal airflow, thoracic respiratory movement, and oxygen saturation with a pulse oximetry. Snoring was also monitored.  MEDICATIONS Patient self administered medications include: N/A.  Current Outpatient Medications:  .  Acetaminophen (TYLENOL PO), Take 1,000 mg by mouth 2 (two) times daily as needed (for headaches). , Disp: , Rfl:  .  albuterol (PROAIR HFA) 108 (90 Base) MCG/ACT inhaler, Inhale 2 puffs into the lungs every 6 (six) hours as needed for wheezing or shortness of breath., Disp: 8 g, Rfl: 1 .  Ascorbic Acid (VITAMIN C) 100 MG tablet, Take by mouth., Disp: , Rfl:  .  Aspirin-Acetaminophen-Caffeine (EXCEDRIN PO), Take by mouth as needed., Disp: , Rfl:  .  BIOTIN PO, Take by mouth daily., Disp: , Rfl:  .  Cholecalciferol (VITAMIN D3) 5000 units CAPS, Take 1 capsule by mouth daily., Disp: , Rfl:  .  gabapentin (NEURONTIN) 100 MG capsule, Take 1 capsule by mouth BID., Disp: 60 capsule, Rfl: 1 .  ibuprofen (ADVIL,MOTRIN) 800 MG tablet, Take 1 tablet (800 mg total) by mouth every 8 (eight) hours., Disp: 45 tablet, Rfl: 1 .  Norethin Ace-Eth Estrad-FE (NORETHINDRONE ACET-ETHINYL EST) 1-20 MG-MCG(24) CHEW, norethindrone acetate 1 mg-ethinyl estradiol 20 mcg tablet, Disp: , Rfl:  .  tretinoin (RETIN-A) 0.05 % cream, tretinoin 0.05 %  topical cream, Disp: , Rfl:  .  Vitamin D, Ergocalciferol, (DRISDOL) 50000 units CAPS capsule, ergocalciferol (vitamin D2) 50,000 unit capsule  TK 1 C PO Q WEEK, Disp: , Rfl:    SLEEP ARCHITECTURE Patient was studied for 333.9 minutes. The sleep efficiency was 69.6 % and the patient was supine for 85.1%. The arousal index was 0.0 per hour.  RESPIRATORY PARAMETERS The overall AHI was 0.2 per hour, with a central apnea index of 0.0 per hour.  The oxygen nadir was 91% during sleep.     CARDIAC DATA Mean heart rate during sleep was 72.8 bpm.  IMPRESSIONS No significant obstructive sleep apnea occurred during this study (AHI = 0.2/h). No significant central sleep apnea occurred during this study (CAI = 0.0/h). The patient had minimal or no oxygen desaturation during the study (Min O2 = 91%) No snoring was audible during this study.    Argie RammingKofi A Lennie Dunnigan, MD Diplomate, American Board of Sleep Medicine.  ELECTRONICALLY SIGNED ON:  05/27/2017, 8:18 PM [facility] PH: [phone]   FX: [phone] ACCREDITED BY THE AMERICAN ACADEMY OF SLEEP MEDICINE

## 2017-05-29 ENCOUNTER — Other Ambulatory Visit (INDEPENDENT_AMBULATORY_CARE_PROVIDER_SITE_OTHER): Payer: Self-pay | Admitting: Radiology

## 2017-05-29 DIAGNOSIS — M545 Low back pain: Secondary | ICD-10-CM

## 2017-06-02 ENCOUNTER — Ambulatory Visit
Admission: RE | Admit: 2017-06-02 | Discharge: 2017-06-02 | Disposition: A | Discharge status: Home / Self Care | Payer: 59 | Source: Ambulatory Visit | Attending: Orthopaedic Surgery | Admitting: Orthopaedic Surgery

## 2017-06-02 DIAGNOSIS — M545 Low back pain: Secondary | ICD-10-CM

## 2017-06-04 ENCOUNTER — Ambulatory Visit (INDEPENDENT_AMBULATORY_CARE_PROVIDER_SITE_OTHER): Payer: 59 | Admitting: Orthopaedic Surgery

## 2017-06-21 ENCOUNTER — Encounter (INDEPENDENT_AMBULATORY_CARE_PROVIDER_SITE_OTHER): Payer: Self-pay | Admitting: Orthopaedic Surgery

## 2017-06-21 ENCOUNTER — Ambulatory Visit (INDEPENDENT_AMBULATORY_CARE_PROVIDER_SITE_OTHER): Payer: 59 | Admitting: Orthopaedic Surgery

## 2017-06-21 VITALS — BP 116/67 | HR 89 | Resp 14 | Ht 63.0 in | Wt 157.0 lb

## 2017-06-21 DIAGNOSIS — M5441 Lumbago with sciatica, right side: Secondary | ICD-10-CM | POA: Diagnosis not present

## 2017-06-21 DIAGNOSIS — G8929 Other chronic pain: Secondary | ICD-10-CM

## 2017-06-21 NOTE — Progress Notes (Signed)
Office Visit Note   Patient: Allison Kane           Date of Birth: 1986/01/12           MRN: [id number] Visit Date: 06/21/2017              Requested by: Waldon Merl, PA-C [address] [place], [place] [address] PCP: Waldon Merl, PA-C   Assessment & Plan: Visit Diagnoses:  1. Chronic right-sided low back pain with right-sided sciatica     Plan: MRI scan demonstrates a broad-based central and eccentric to the right protrusion at L5-S1 that encroaches on the right S1 nerve root in the subarticular recess certainly could account for some of the right-sided radicular symptoms a long discussion regarding the findings.  Copy of scan given to the patient.  Have discussed different treatment options including NSAIDs, exercises, epidural steroid injection even surgery.  She like to proceed with the exercises and continue the NSAIDs she will let us know if she like to have an injection some point in the future  Follow-Up Instructions: Return if symptoms worsen or fail to improve.   Orders:  No orders of the defined types were placed in this encounter.  No orders of the defined types were placed in this encounter.     Procedures: No procedures performed   Clinical Data: No additional findings.   Subjective: No chief complaint on file. Allison Kane is had no chronic problem with low back pain with right lower extremity radiculopathy per previous office note.  We did obtain an MRI scan with the results as above.  No change in symptoms.  Has more back than leg pain.  The leg pain is only on an occasional basis and will refer as far distally as her right foot.  Dysfunction  HPI  Review of Systems   Objective: Vital Signs: There were no vitals taken for this visit.  Physical Exam  Constitutional: She is oriented to person, place, and time. She appears well-developed and well-nourished.  HENT:  Mouth/Throat: Oropharynx is clear and moist.  Eyes: Pupils are  equal, round, and reactive to light. EOM are normal.  Pulmonary/Chest: Effort normal.  Neurological: She is alert and oriented to person, place, and time.  Skin: Skin is warm and dry.  Psychiatric: She has a normal mood and affect. Her behavior is normal.    Ortho Exam awake alert and oriented x3.  Comfortable sitting.  Hamstrings tight bilaterally.  Left ankle reflex is more active than the right.  Motor exam intact sensory exam intact.  No percussible tenderness lumbar spine.  Specialty Comments:  No specialty comments available.  Imaging: No results found.   PMFS History: Patient Active Problem List   Diagnosis Date Noted  . History of abnormal cervical Papanicolaou smear 05/04/2017  . Nasal septal deviation 05/04/2015  . Asthma 03/23/2015  . GERD without esophagitis 03/23/2015  . IBS (irritable bowel syndrome) 03/23/2015  . S/P cesarean section 01/12/2014  . History of abnormal Pap smear 09/09/2012   Past Medical History:  Diagnosis Date  . Allergy   . Asthma   . Frequent headaches   . GERD (gastroesophageal reflux disease)   . IBS (irritable bowel syndrome)   . Migraines     Family History  Problem Relation Age of Onset  . Cancer Maternal Aunt   . Osteoporosis Maternal Grandmother   . Arthritis Maternal Grandmother   . Birth defects Maternal Grandmother   . Cancer  Maternal Grandmother   . Hyperlipidemia Maternal Grandmother   . Hyperlipidemia Mother   . Diabetes Father   . Hyperlipidemia Father   . Hyperlipidemia Brother   . Arthritis Paternal Grandmother   . Asthma Paternal Grandmother   . Cancer Paternal Grandmother   . Hearing loss Paternal Grandmother   . Hyperlipidemia Paternal Grandmother   . Hypertension Paternal Grandmother     Past Surgical History:  Procedure Laterality Date  . CERVICAL BIOPSY  W/ LOOP ELECTRODE EXCISION  2011   HGSIL  . CESAREAN SECTION     2013  . CESAREAN SECTION N/A 01/12/2014   Procedure: CESAREAN SECTION;  Surgeon:  Sherian Rein, MD;  Location: [facility];  Service: Obstetrics;  Laterality: N/A;   Social History   Occupational History  . Not on file  Tobacco Use  . Smoking status: Former Smoker    Last attempt to quit: 10/13/2011    Years since quitting: 5.6  . Smokeless tobacco: Never Used  Substance and Sexual Activity  . Alcohol use: Yes    Comment: 5 drinks/month  . Drug use: No  . Sexual activity: Yes    Partners: Male    Birth control/protection: None     Valeria Batman, MD   Note - This record has been created using [employer].  Chart creation errors have been sought, but may not always  have been located. Such creation errors do not reflect on  the standard of medical care.

## 2017-06-21 NOTE — Patient Instructions (Signed)

## 2017-06-26 ENCOUNTER — Ambulatory Visit (INDEPENDENT_AMBULATORY_CARE_PROVIDER_SITE_OTHER): Payer: 59 | Admitting: Podiatry

## 2017-06-26 ENCOUNTER — Encounter: Payer: Self-pay | Admitting: Podiatry

## 2017-06-26 DIAGNOSIS — L600 Ingrowing nail: Secondary | ICD-10-CM | POA: Diagnosis not present

## 2017-06-26 MED ORDER — TRAMADOL HCL 50 MG PO TABS: 50.0000 mg | ORAL_TABLET | Freq: Three times a day (TID) | ORAL | 0 refills | Status: DC | PRN

## 2017-06-26 MED ORDER — SULFAMETHOXAZOLE-TRIMETHOPRIM 800-160 MG PO TABS: 1.0000 | ORAL_TABLET | Freq: Two times a day (BID) | ORAL | 0 refills | Status: DC

## 2017-06-26 NOTE — Patient Instructions (Signed)

## 2017-06-26 NOTE — Progress Notes (Signed)
Subjective:    Patient ID: Allison Kane, female    DOB: 11-Dec-1985, 32 y.o.   MRN: [id number]  HPI 32 year old female presents the office today for concerns of ingrown toenails to both of her big toes on both nail corners which is been ongoing for several years and is been getting worse.  She will try to trim them out herself and goes right back causing pain.  She states that the right lateral she points to it has been worse recently.  Denies any pus.  She noticed some puffiness around the nail corners.  She has no other concerns.   Review of Systems  All other systems reviewed and are negative.  Past Medical History:  Diagnosis Date  . Allergy   . Asthma   . Frequent headaches   . GERD (gastroesophageal reflux disease)   . IBS (irritable bowel syndrome)   . Migraines     Past Surgical History:  Procedure Laterality Date  . CERVICAL BIOPSY  W/ LOOP ELECTRODE EXCISION  2011   HGSIL  . CESAREAN SECTION     2013  . CESAREAN SECTION N/A 01/12/2014   Procedure: CESAREAN SECTION;  Surgeon: Sherian Rein, MD;  Location: [facility];  Service: Obstetrics;  Laterality: N/A;     Current Outpatient Medications:  .  Acetaminophen (TYLENOL PO), Take 1,000 mg by mouth 2 (two) times daily as needed (for headaches). , Disp: , Rfl:  .  albuterol (PROAIR HFA) 108 (90 Base) MCG/ACT inhaler, Inhale 2 puffs into the lungs every 6 (six) hours as needed for wheezing or shortness of breath., Disp: 8 g, Rfl: 1 .  Ascorbic Acid (VITAMIN C) 100 MG tablet, Take by mouth., Disp: , Rfl:  .  Aspirin-Acetaminophen-Caffeine (EXCEDRIN PO), Take by mouth as needed., Disp: , Rfl:  .  BIOTIN PO, Take by mouth daily., Disp: , Rfl:  .  Calcium-Vitamins C & D (CALCIUM/C/D PO), Take by mouth., Disp: , Rfl:  .  Cholecalciferol (VITAMIN D3) 5000 units CAPS, Take 1 capsule by mouth daily., Disp: , Rfl:  .  gabapentin (NEURONTIN) 100 MG capsule, Take 1 capsule by mouth BID., Disp: 60 capsule, Rfl: 1 .   ibuprofen (ADVIL,MOTRIN) 800 MG tablet, Take 1 tablet (800 mg total) by mouth every 8 (eight) hours., Disp: 45 tablet, Rfl: 1 .  Norethin Ace-Eth Estrad-FE (NORETHINDRONE ACET-ETHINYL EST) 1-20 MG-MCG(24) CHEW, norethindrone acetate 1 mg-ethinyl estradiol 20 mcg tablet, Disp: , Rfl:  .  sulfamethoxazole-trimethoprim (BACTRIM DS,SEPTRA DS) 800-160 MG tablet, Take 1 tablet by mouth 2 (two) times daily., Disp: 20 tablet, Rfl: 0 .  traMADol (ULTRAM) 50 MG tablet, Take 1 tablet (50 mg total) by mouth every 8 (eight) hours as needed., Disp: 10 tablet, Rfl: 0 .  tretinoin (RETIN-A) 0.05 % cream, tretinoin 0.05 % topical cream, Disp: , Rfl:  .  Vitamin D, Ergocalciferol, (DRISDOL) 50000 units CAPS capsule, ergocalciferol (vitamin D2) 50,000 unit capsule  TK 1 C PO Q WEEK, Disp: , Rfl:   Allergies  Allergen Reactions  . Peanut-Containing Drug Products     Allergic to all nuts  . Latex Swelling  . Penicillins Other (See Comments)    Reaction:  High fever         Objective:   Physical Exam General: AAO x3, NAD  Dermatological: Incurvation present with the medial lateral aspects of bilateral hallux toenails with tenderness to palpation.  There is localized edema to the nail corners but there is no significant erythema there is no  ascending cellulitis.There is no fluctuation or crepitation or any malodor.  No other open lesions or pre-ulcerative lesions.  Vascular: Dorsalis Pedis artery and Posterior Tibial artery pedal pulses are 2/4 bilateral with immedate capillary fill time. There is no pain with calf compression, swelling, warmth, erythema.   Neruologic: Grossly intact via light touch bilateral. Protective threshold with Semmes Wienstein monofilament intact to all pedal sites bilateral.   Musculoskeletal: No gross boney pedal deformities bilateral. No pain, crepitus, or limitation noted with foot and ankle range of motion bilateral. Muscular strength 5/5 in all groups tested bilateral.  Gait:  Unassisted, Nonantalgic.      Assessment & Plan:  Bilateral hallux and traumatic ingrown toenails -Treatment options discussed including all alternatives, risks, and complications -Etiology of symptoms were discussed -At this time she wishes to proceed with partial nail avulsions to both nail corners of both big toes today.  Alternatives, risks, complications were discussed. The Risks and complications were discussed with the patient for which they understand and written consent was obtained. Under sterile conditions a total of 3 mL of a mixture of 2% lidocaine plain and 0.5% Marcaine plain was infiltrated in a hallux block fashion. Once anesthetized, the skin was prepped in sterile fashion. A tourniquet was then applied. Next the medial and lateral aspect of hallux nail border was then sharply excised making sure to remove the entire offending nail border. Once the nails were ensured to be removed area was debrided and the underlying skin was intact. There is no purulence identified in the procedure. Next phenol was then applied under standard conditions and copiously irrigated. Silvadene was applied. A dry sterile dressing was applied. After application of the dressing the tourniquet was removed and there is found to be an immediate capillary refill time to the digit. The patient tolerated the procedure well any complications. Post procedure instructions were discussed the patient for which he verbally understood. Follow-up in one week for nail check or sooner if any problems are to arise. Discussed signs/symptoms of infection and directed to call the office immediately should any occur or go directly to the emergency room. In the meantime, encouraged to call the office with any questions, concerns, changes symptoms. -Bactrim prescribed -Tramadol for pain, NSAIDs if not needed  Vivi Barrack DPM

## 2017-07-02 ENCOUNTER — Other Ambulatory Visit: Payer: Self-pay | Admitting: Physician Assistant

## 2017-07-03 ENCOUNTER — Ambulatory Visit (INDEPENDENT_AMBULATORY_CARE_PROVIDER_SITE_OTHER): Payer: 59 | Admitting: Podiatry

## 2017-07-03 ENCOUNTER — Encounter: Payer: Self-pay | Admitting: Podiatry

## 2017-07-03 DIAGNOSIS — L600 Ingrowing nail: Secondary | ICD-10-CM

## 2017-07-03 DIAGNOSIS — Z9889 Other specified postprocedural states: Secondary | ICD-10-CM

## 2017-07-03 NOTE — Patient Instructions (Signed)

## 2017-07-05 NOTE — Progress Notes (Signed)
Subjective: Allison Kane is a 32 y.o. female returns to office today for follow up evaluation after having bilateral hallux medial and lateral partial nail avulsion performed. Patient has been soaking using Epsom salts and applying topical antibiotic covered with bandaid daily.  She has some soreness the first day but the pain resolved.  Denies any drainage or pus or any red streaks.  Patient denies fevers, chills, nausea, vomiting. Denies any calf pain, chest pain, SOB.   Objective:  Vitals: Reviewed  General: Well developed, nourished, in no acute distress, alert and oriented x3   Dermatology: Skin is warm, dry and supple bilateral.  Bilateral hallux nail border appears to be clean, dry, with mild granular tissue and surrounding scab. There is no surrounding erythema, edema, drainage/purulence. The remaining nails appear unremarkable at this time. There are no other lesions or other signs of infection present.  Neurovascular status: Intact. No lower extremity swelling; No pain with calf compression bilateral.  Musculoskeletal: Decreased tenderness to palpation of the medial and lateral hallux nail folds. Muscular strength within normal limits bilateral.   Assesement and Plan: S/p partial nail avulsion, doing well.   -Continue soaking in epsom salts twice a day followed by antibiotic ointment and a band-aid. Can leave uncovered at night. Continue this until completely healed.  -If the area has not healed in 2 weeks, call the office for follow-up appointment, or sooner if any problems arise.  -Monitor for any signs/symptoms of infection. Call the office immediately if any occur or go directly to the emergency room. Call with any questions/concerns.  Ovid Curd, DPM

## 2017-07-22 ENCOUNTER — Other Ambulatory Visit: Payer: Self-pay | Admitting: Physician Assistant

## 2017-07-23 ENCOUNTER — Encounter: Payer: Self-pay | Admitting: Emergency Medicine

## 2017-07-23 NOTE — Telephone Encounter (Signed)
Will call patient to verify if patient is tolerating medication.

## 2017-07-24 NOTE — Telephone Encounter (Signed)
Patient states: When I take it twice a day it is working a keeping the pain at bay. Only when I do intense work do I feel any pain but not as bad as it was before.   Do you want to keep the dose the same or increase dosage.

## 2017-07-27 ENCOUNTER — Other Ambulatory Visit: Payer: Self-pay | Admitting: Physician Assistant

## 2017-08-24 ENCOUNTER — Ambulatory Visit (INDEPENDENT_AMBULATORY_CARE_PROVIDER_SITE_OTHER): Payer: 59 | Admitting: Physician Assistant

## 2017-08-24 ENCOUNTER — Other Ambulatory Visit: Payer: Self-pay

## 2017-08-24 ENCOUNTER — Encounter: Payer: Self-pay | Admitting: Physician Assistant

## 2017-08-24 VITALS — BP 110/78 | HR 91 | Temp 98.2°F | Resp 14 | Ht 63.0 in | Wt 152.0 lb

## 2017-08-24 DIAGNOSIS — T63441A Toxic effect of venom of bees, accidental (unintentional), initial encounter: Secondary | ICD-10-CM

## 2017-08-24 DIAGNOSIS — Z23 Encounter for immunization: Secondary | ICD-10-CM

## 2017-08-24 DIAGNOSIS — S81811A Laceration without foreign body, right lower leg, initial encounter: Secondary | ICD-10-CM | POA: Diagnosis not present

## 2017-08-24 MED ORDER — METHYLPREDNISOLONE ACETATE 80 MG/ML IJ SUSP
80.0000 mg | Freq: Once | INTRAMUSCULAR | Status: AC
  Administered 2017-08-24: 80 mg via INTRAMUSCULAR

## 2017-08-24 MED ORDER — CEPHALEXIN 500 MG PO CAPS: 500.0000 mg | ORAL_CAPSULE | Freq: Two times a day (BID) | ORAL | 0 refills | Status: AC

## 2017-08-24 NOTE — Progress Notes (Signed)
Patient presents to clinic today c/o yellow jacket sting to left upper eyelid occurring 2 days ago while weed-eating her yard. Notes pain initially without other symptoms. Since that time has developed swelling of eyelid with itching. Denies vision changes other than difficulty opening eye due to swelling. Denies fever, chills or drainage.   Past Medical History:  Diagnosis Date  . Allergy   . Asthma   . Frequent headaches   . GERD (gastroesophageal reflux disease)   . IBS (irritable bowel syndrome)   . Migraines     Current Outpatient Medications on File Prior to Visit  Medication Sig Dispense Refill  . Acetaminophen (TYLENOL PO) Take 1,000 mg by mouth 2 (two) times daily as needed (for headaches).     . Ascorbic Acid (VITAMIN C) 100 MG tablet Take 250 mg by mouth daily.     . Aspirin-Acetaminophen-Caffeine (EXCEDRIN PO) Take by mouth as needed.    Marland Kitchen. BIOTIN PO Take 1 tablet by mouth daily.     . Calcium-Vitamins C & D (CALCIUM/C/D PO) Take 1,000 mg by mouth daily.     . Cholecalciferol (VITAMIN D3) 5000 units CAPS Take 1 capsule by mouth daily.    Marland Kitchen. gabapentin (NEURONTIN) 100 MG capsule TAKE 1 CAPSULE BY MOUTH TWICE DAILY 60 capsule 2  . Norethin Ace-Eth Estrad-FE (NORETHINDRONE ACET-ETHINYL EST) 1-20 MG-MCG(24) CHEW norethindrone acetate 1 mg-ethinyl estradiol 20 mcg tablet    . PROAIR HFA 108 (90 Base) MCG/ACT inhaler INHALE 2 PUFFS INTO THE LUNGS EVERY 6 HOURS AS NEEDED FOR WHEEZING OR SHORTNESS OF BREATH 18 g 3   No current facility-administered medications on file prior to visit.     Allergies  Allergen Reactions  . Peanut-Containing Drug Products     Allergic to all nuts  . Wasp Venom Anaphylaxis and Swelling  . Latex Swelling  . Penicillins Other (See Comments)    Reaction:  High fever    Family History  Problem Relation Age of Onset  . Cancer Maternal Aunt   . Osteoporosis Maternal Grandmother   . Arthritis Maternal Grandmother   . Birth defects Maternal  Grandmother   . Cancer Maternal Grandmother   . Hyperlipidemia Maternal Grandmother   . Hyperlipidemia Mother   . Diabetes Father   . Hyperlipidemia Father   . Hyperlipidemia Brother   . Arthritis Paternal Grandmother   . Asthma Paternal Grandmother   . Cancer Paternal Grandmother   . Hearing loss Paternal Grandmother   . Hyperlipidemia Paternal Grandmother   . Hypertension Paternal Grandmother     Social History   Socioeconomic History  . Marital status: Married    Spouse name: Not on file  . Number of children: Not on file  . Years of education: Not on file  . Highest education level: Not on file  Occupational History  . Not on file  Social Needs  . Financial resource strain: Not on file  . Food insecurity:    Worry: Not on file    Inability: Not on file  . Transportation needs:    Medical: Not on file    Non-medical: Not on file  Tobacco Use  . Smoking status: Former Smoker    Packs/day: 0.50    Years: 9.00    Pack years: 4.50    Types: Cigarettes    Last attempt to quit: 10/13/2011    Years since quitting: 5.8  . Smokeless tobacco: Never Used  Substance and Sexual Activity  . Alcohol use: Yes  Comment: 5 drinks/month  . Drug use: No  . Sexual activity: Yes    Partners: Male    Birth control/protection: None  Lifestyle  . Physical activity:    Days per week: Not on file    Minutes per session: Not on file  . Stress: Not on file  Relationships  . Social connections:    Talks on phone: Not on file    Gets together: Not on file    Attends religious service: Not on file    Active member of club or organization: Not on file    Attends meetings of clubs or organizations: Not on file    Relationship status: Not on file  Other Topics Concern  . Not on file  Social History Narrative  . Not on file   Review of Systems - See HPI.  All other ROS are negative.  BP 110/78   Pulse 91   Temp 98.2 F (36.8 C) (Oral)   Resp 14   Ht 5\' 3"  (1.6 m)   Wt 152  lb (68.9 kg)   SpO2 98%   Breastfeeding? No   BMI 26.93 kg/m   Physical Exam  Constitutional: She appears well-developed and well-nourished.  HENT:  Head: Normocephalic and atraumatic.  Eyes:    Neck: Neck supple.  Cardiovascular: Normal rate, regular rhythm, normal heart sounds and intact distal pulses.  Pulmonary/Chest: Effort normal and breath sounds normal. No stridor. No respiratory distress. She has no wheezes. She has no rales. She exhibits no tenderness.  Lymphadenopathy:    She has no cervical adenopathy.  Skin:     Vitals reviewed.  Assessment/Plan: 1. Allergic reaction to bee sting Localized delayed reaction with edema 2/2 histamine release. No systemic symptoms noted. PERRLA. No noted masses or evidence of cellulitis. 80 mg IM depomedrol given to help with swelling. Start OTC antihistamines and cool compresses. Alarm signs/symptoms reviewed with patient. - methylPREDNISolone acetate (DEPO-MEDROL) injection 80 mg  2. Need for Tdap vaccination TDaP given today. - Tdap vaccine greater than or equal to 7yo IM  3. Laceration of right lower leg, initial encounter Noted on physical examination. Patient endorses occurring 1 week ago when a metal shopping cart rolled over her leg. Routine healing noted without sign of infection. Rx Keflex printed and given with instructions on when to use if needed. TDaP overdue. Updated today. Follow-up PRN if not continuing to heal.   Piedad Climes, PA-C

## 2017-08-24 NOTE — Patient Instructions (Signed)
Please keep hydrated. Avoid touching the eye. Cool compresses to the eye to help with swelling and itch. Take Benadryl to help with swelling -- can take up to every 6 hours for this.  Symptoms should only improve from this point forward.  Your Tetanus was updated today due to sting and recent laceration to your leg which is healing nicely.  I have printed a prescription for Keflex for you to start if you note any redness or tenderness of the leg.  Take as directed.  Follow-up if all symptoms are not resolving.

## 2017-11-02 ENCOUNTER — Other Ambulatory Visit: Payer: Self-pay

## 2017-11-02 ENCOUNTER — Encounter: Payer: Self-pay | Admitting: Physician Assistant

## 2017-11-02 ENCOUNTER — Ambulatory Visit (INDEPENDENT_AMBULATORY_CARE_PROVIDER_SITE_OTHER): Payer: 59 | Admitting: Physician Assistant

## 2017-11-02 VITALS — BP 98/72 | HR 76 | Temp 98.2°F | Resp 14 | Ht 63.0 in | Wt 152.0 lb

## 2017-11-02 DIAGNOSIS — J320 Chronic maxillary sinusitis: Secondary | ICD-10-CM

## 2017-11-02 MED ORDER — GABAPENTIN 300 MG PO CAPS: 300.0000 mg | ORAL_CAPSULE | Freq: Two times a day (BID) | ORAL | 3 refills | Status: DC

## 2017-11-02 MED ORDER — SULFAMETHOXAZOLE-TRIMETHOPRIM 800-160 MG PO TABS: 1.0000 | ORAL_TABLET | Freq: Two times a day (BID) | ORAL | 0 refills | Status: DC

## 2017-11-02 NOTE — Patient Instructions (Signed)
Please start the antibiotic as directed. Use Flonase daily for nasal congestion. Continue daily antihistamine.  I am setting you up with ENT for further evaluation and continuation of therapy. They will help guide us with treatment duration as well as to make sure there is no obstruction contributing to symptoms.   Also, start the new dose of Gabapentin. Follow-up in 1 month regarding back pain.

## 2017-11-02 NOTE — Progress Notes (Signed)
Patient presents to clinic today c/o 2+ months of sinus pressure, sinus pain and headache with daily nasal congestion. Denies fever or chills. Has been having tooth and facial pain x 3 days. Was seen by her dentist early this week and dental imaging was performed. Was noted to have significant inflammation and fullness of her left maxillary sinus. Was recommended to see PCP.Marland Kitchen   Past Medical History:  Diagnosis Date  . Allergy   . Asthma   . Frequent headaches   . GERD (gastroesophageal reflux disease)   . IBS (irritable bowel syndrome)   . Migraines     Current Outpatient Medications on File Prior to Visit  Medication Sig Dispense Refill  . Acetaminophen (TYLENOL PO) Take 1,000 mg by mouth 2 (two) times daily as needed (for headaches).     . Ascorbic Acid (VITAMIN C) 100 MG tablet Take 250 mg by mouth daily.     . Aspirin-Acetaminophen-Caffeine (EXCEDRIN PO) Take by mouth as needed.    Marland Kitchen BIOTIN PO Take 1 tablet by mouth daily.     . Calcium-Vitamins C & D (CALCIUM/C/D PO) Take 1,000 mg by mouth daily.     . cetirizine (ZYRTEC) 10 MG tablet Take 10 mg by mouth daily.    . Cholecalciferol (VITAMIN D3) 5000 units CAPS Take 1 capsule by mouth daily.    . fluticasone (FLONASE) 50 MCG/ACT nasal spray Place into both nostrils daily.    . Norethin Ace-Eth Estrad-FE (NORETHINDRONE ACET-ETHINYL EST) 1-20 MG-MCG(24) CHEW norethindrone acetate 1 mg-ethinyl estradiol 20 mcg tablet    . PROAIR HFA 108 (90 Base) MCG/ACT inhaler INHALE 2 PUFFS INTO THE LUNGS EVERY 6 HOURS AS NEEDED FOR WHEEZING OR SHORTNESS OF BREATH 18 g 3   No current facility-administered medications on file prior to visit.     Allergies  Allergen Reactions  . Peanut-Containing Drug Products     Allergic to all nuts  . Wasp Venom Anaphylaxis and Swelling  . Latex Swelling  . Penicillins Other (See Comments)    Reaction:  High fever    Family History  Problem Relation Age of Onset  . Cancer Maternal Aunt   .  Osteoporosis Maternal Grandmother   . Arthritis Maternal Grandmother   . Birth defects Maternal Grandmother   . Cancer Maternal Grandmother   . Hyperlipidemia Maternal Grandmother   . Hyperlipidemia Mother   . Diabetes Father   . Hyperlipidemia Father   . Hyperlipidemia Brother   . Arthritis Paternal Grandmother   . Asthma Paternal Grandmother   . Cancer Paternal Grandmother   . Hearing loss Paternal Grandmother   . Hyperlipidemia Paternal Grandmother   . Hypertension Paternal Grandmother     Social History   Socioeconomic History  . Marital status: Married    Spouse name: Not on file  . Number of children: Not on file  . Years of education: Not on file  . Highest education level: Not on file  Occupational History  . Not on file  Social Needs  . Financial resource strain: Not on file  . Food insecurity:    Worry: Not on file    Inability: Not on file  . Transportation needs:    Medical: Not on file    Non-medical: Not on file  Tobacco Use  . Smoking status: Former Smoker    Packs/day: 0.50    Years: 9.00    Pack years: 4.50    Types: Cigarettes    Last attempt to quit: 10/13/2011  Years since quitting: 6.0  . Smokeless tobacco: Never Used  Substance and Sexual Activity  . Alcohol use: Yes    Comment: 5 drinks/month  . Drug use: No  . Sexual activity: Yes    Partners: Male    Birth control/protection: None  Lifestyle  . Physical activity:    Days per week: Not on file    Minutes per session: Not on file  . Stress: Not on file  Relationships  . Social connections:    Talks on phone: Not on file    Gets together: Not on file    Attends religious service: Not on file    Active member of club or organization: Not on file    Attends meetings of clubs or organizations: Not on file    Relationship status: Not on file  Other Topics Concern  . Not on file  Social History Narrative  . Not on file   Review of Systems - See HPI.  All other ROS are  negative.  BP 98/72   Pulse 76   Temp 98.2 F (36.8 C) (Oral)   Resp 14   Ht 5\' 3"  (1.6 m)   Wt 152 lb (68.9 kg)   SpO2 98%   BMI 26.93 kg/m   Physical Exam  Constitutional: She appears well-developed and well-nourished.  HENT:  Head: Normocephalic and atraumatic.  Right Ear: Tympanic membrane and external ear normal.  Left Ear: Tympanic membrane and external ear normal.  Nose: Mucosal edema and rhinorrhea present. Right sinus exhibits no maxillary sinus tenderness and no frontal sinus tenderness. Left sinus exhibits maxillary sinus tenderness. Left sinus exhibits no frontal sinus tenderness.  Mouth/Throat: Uvula is midline, oropharynx is clear and moist and mucous membranes are normal.  Eyes: Conjunctivae are normal.  Neck: Neck supple.  Cardiovascular: Normal rate, regular rhythm, normal heart sounds and intact distal pulses.  Pulmonary/Chest: Effort normal.  Psychiatric: She has a normal mood and affect.  Vitals reviewed.  Assessment/Plan: 1. Chronic maxillary sinusitis Rx Bactrim x 10 days. Needs ENT assessment giving concern for more chronic rhinosinusitis to r/o obstruction.  Increase fluids.  Rest.  Saline nasal spray.  Probiotic.  Mucinex as directed.  Humidifier in bedroom.  Call or return to clinic if symptoms are not improving.  - sulfamethoxazole-trimethoprim (BACTRIM DS,SEPTRA DS) 800-160 MG tablet; Take 1 tablet by mouth 2 (two) times daily.  Dispense: 20 tablet; Refill: 0 - Ambulatory referral to ENT   Piedad ClimesWilliam Cody Peachie Barkalow, PA-C

## 2017-12-10 ENCOUNTER — Ambulatory Visit (INDEPENDENT_AMBULATORY_CARE_PROVIDER_SITE_OTHER): Payer: 59 | Admitting: Physician Assistant

## 2017-12-10 ENCOUNTER — Encounter: Payer: Self-pay | Admitting: Physician Assistant

## 2017-12-10 ENCOUNTER — Other Ambulatory Visit: Payer: Self-pay

## 2017-12-10 DIAGNOSIS — G8929 Other chronic pain: Secondary | ICD-10-CM

## 2017-12-10 DIAGNOSIS — M545 Low back pain: Secondary | ICD-10-CM

## 2017-12-10 MED ORDER — AZELASTINE-FLUTICASONE 137-50 MCG/ACT NA SUSP: 2.0000 | Freq: Every day | NASAL | 0 refills | Status: DC

## 2017-12-10 NOTE — Assessment & Plan Note (Signed)
Improved significantly with Gabapentin 300 mg TID. Continue current regimen. OTC medications reviewed. Follow-up 3 months.

## 2017-12-10 NOTE — Progress Notes (Signed)
Patient presents to clinic today for follow-up of chronic low back pain after addition of Gabapentin 300 mg BID, titrating to TID. Is taking as directed and tolerating well without side effect. Is taking medications as directed. Notes significant improvement in pain with medication. No more sciatica. Pain decreased to 1/10.   Past Medical History:  Diagnosis Date  . Allergy   . Asthma   . Frequent headaches   . GERD (gastroesophageal reflux disease)   . IBS (irritable bowel syndrome)   . Migraines     Current Outpatient Medications on File Prior to Visit  Medication Sig Dispense Refill  . Acetaminophen (TYLENOL PO) Take 1,000 mg by mouth 2 (two) times daily as needed (for headaches).     . Ascorbic Acid (VITAMIN C) 100 MG tablet Take 250 mg by mouth daily.     . Aspirin-Acetaminophen-Caffeine (EXCEDRIN PO) Take by mouth as needed.    Marland Kitchen BIOTIN PO Take 1 tablet by mouth daily.     . Calcium-Vitamins C & D (CALCIUM/C/D PO) Take 1,000 mg by mouth daily.     . cetirizine (ZYRTEC) 10 MG tablet Take 10 mg by mouth daily.    . Cholecalciferol (VITAMIN D3) 5000 units CAPS Take 1 capsule by mouth daily.    . fluticasone (FLONASE) 50 MCG/ACT nasal spray Place into both nostrils daily.    Marland Kitchen gabapentin (NEURONTIN) 300 MG capsule Take 1 capsule (300 mg total) by mouth 2 (two) times daily. 60 capsule 3  . Norethin Ace-Eth Estrad-FE (NORETHINDRONE ACET-ETHINYL EST) 1-20 MG-MCG(24) CHEW norethindrone acetate 1 mg-ethinyl estradiol 20 mcg tablet    . PROAIR HFA 108 (90 Base) MCG/ACT inhaler INHALE 2 PUFFS INTO THE LUNGS EVERY 6 HOURS AS NEEDED FOR WHEEZING OR SHORTNESS OF BREATH 18 g 3   No current facility-administered medications on file prior to visit.     Allergies  Allergen Reactions  . Peanut-Containing Drug Products     Allergic to all nuts  . Wasp Venom Anaphylaxis and Swelling  . Latex Swelling  . Penicillins Other (See Comments)    Reaction:  High fever    Family History  Problem  Relation Age of Onset  . Cancer Maternal Aunt   . Osteoporosis Maternal Grandmother   . Arthritis Maternal Grandmother   . Birth defects Maternal Grandmother   . Cancer Maternal Grandmother   . Hyperlipidemia Maternal Grandmother   . Hyperlipidemia Mother   . Diabetes Father   . Hyperlipidemia Father   . Hyperlipidemia Brother   . Arthritis Paternal Grandmother   . Asthma Paternal Grandmother   . Cancer Paternal Grandmother   . Hearing loss Paternal Grandmother   . Hyperlipidemia Paternal Grandmother   . Hypertension Paternal Grandmother     Social History   Socioeconomic History  . Marital status: Married    Spouse name: Not on file  . Number of children: Not on file  . Years of education: Not on file  . Highest education level: Not on file  Occupational History  . Not on file  Social Needs  . Financial resource strain: Not on file  . Food insecurity:    Worry: Not on file    Inability: Not on file  . Transportation needs:    Medical: Not on file    Non-medical: Not on file  Tobacco Use  . Smoking status: Former Smoker    Packs/day: 0.50    Years: 9.00    Pack years: 4.50    Types: Cigarettes  Last attempt to quit: 10/13/2011    Years since quitting: 6.1  . Smokeless tobacco: Never Used  Substance and Sexual Activity  . Alcohol use: Yes    Comment: 5 drinks/month  . Drug use: No  . Sexual activity: Yes    Partners: Male    Birth control/protection: None  Lifestyle  . Physical activity:    Days per week: Not on file    Minutes per session: Not on file  . Stress: Not on file  Relationships  . Social connections:    Talks on phone: Not on file    Gets together: Not on file    Attends religious service: Not on file    Active member of club or organization: Not on file    Attends meetings of clubs or organizations: Not on file    Relationship status: Not on file  Other Topics Concern  . Not on file  Social History Narrative  . Not on file   Review  of Systems - See HPI.  All other ROS are negative.  BP 102/60   Pulse 86   Temp 98.2 F (36.8 C) (Oral)   Resp 14   Ht 5\' 3"  (1.6 m)   Wt 146 lb (66.2 kg)   SpO2 98%   BMI 25.86 kg/m   Physical Exam  Constitutional: She is oriented to person, place, and time. She appears well-developed and well-nourished.  HENT:  Head: Normocephalic and atraumatic.  Eyes: Pupils are equal, round, and reactive to light. Conjunctivae are normal.  Cardiovascular: Normal rate, regular rhythm, normal heart sounds and intact distal pulses.  Pulmonary/Chest: Effort normal and breath sounds normal.  Neurological: She is alert and oriented to person, place, and time.  Psychiatric: She has a normal mood and affect.  Vitals reviewed.  Assessment/Plan: Chronic low back pain Improved significantly with Gabapentin 300 mg TID. Continue current regimen. OTC medications reviewed. Follow-up 3 months.     Piedad Climes, PA-C

## 2017-12-10 NOTE — Patient Instructions (Signed)
Continue Gabapentin three times daily.  Add on a daily Aleve at noon. Can alternate every other day with Tylenol arthritis. Let me know if this does not further improve things.  I want you to stop the Flonase and start the Dymista daily.  If not improving we will need to get you in with another ENT.

## 2017-12-11 ENCOUNTER — Other Ambulatory Visit: Payer: Self-pay | Admitting: Emergency Medicine

## 2017-12-11 DIAGNOSIS — J320 Chronic maxillary sinusitis: Secondary | ICD-10-CM

## 2017-12-11 MED ORDER — AZELASTINE HCL 0.1 % NA SOLN: 2.0000 | Freq: Two times a day (BID) | NASAL | 3 refills | Status: DC

## 2017-12-29 ENCOUNTER — Other Ambulatory Visit: Payer: Self-pay | Admitting: Physician Assistant

## 2017-12-31 NOTE — Telephone Encounter (Signed)
Please advise, pt has proair on med list and refill is for ventolin

## 2018-01-04 ENCOUNTER — Encounter: Payer: Self-pay | Admitting: Physician Assistant

## 2018-01-04 ENCOUNTER — Ambulatory Visit (INDEPENDENT_AMBULATORY_CARE_PROVIDER_SITE_OTHER): Payer: 59 | Admitting: Physician Assistant

## 2018-01-04 ENCOUNTER — Other Ambulatory Visit: Payer: Self-pay

## 2018-01-04 VITALS — BP 100/76 | HR 80 | Temp 98.2°F | Resp 14 | Ht 63.0 in | Wt 148.0 lb

## 2018-01-04 DIAGNOSIS — R062 Wheezing: Secondary | ICD-10-CM

## 2018-01-04 DIAGNOSIS — B9689 Other specified bacterial agents as the cause of diseases classified elsewhere: Secondary | ICD-10-CM | POA: Diagnosis not present

## 2018-01-04 DIAGNOSIS — J019 Acute sinusitis, unspecified: Secondary | ICD-10-CM | POA: Diagnosis not present

## 2018-01-04 MED ORDER — PREDNISONE 20 MG PO TABS: 40.0000 mg | ORAL_TABLET | Freq: Every day | ORAL | 0 refills | Status: DC

## 2018-01-04 MED ORDER — IPRATROPIUM-ALBUTEROL 0.5-2.5 (3) MG/3ML IN SOLN
3.0000 mL | Freq: Once | RESPIRATORY_TRACT | Status: AC
  Administered 2018-01-04: 3 mL via RESPIRATORY_TRACT

## 2018-01-04 MED ORDER — IPRATROPIUM-ALBUTEROL 0.5-2.5 (3) MG/3ML IN SOLN: 3.0000 mL | Freq: Four times a day (QID) | RESPIRATORY_TRACT | Status: DC

## 2018-01-04 MED ORDER — HYDROCOD POLST-CPM POLST ER 10-8 MG/5ML PO SUER: 5.0000 mL | Freq: Two times a day (BID) | ORAL | 0 refills | Status: DC | PRN

## 2018-01-04 MED ORDER — DOXYCYCLINE HYCLATE 100 MG PO CAPS: 100.0000 mg | ORAL_CAPSULE | Freq: Two times a day (BID) | ORAL | 0 refills | Status: DC

## 2018-01-04 NOTE — Progress Notes (Signed)
Subjective:     Allison Kane is a 32 y.o. female who presents for evaluation of possible sinusitis. Symptoms include achiness, congestion, facial pain, low grade fever, non productive cough, post nasal drip, purulent nasal discharge and tooth pain. Onset of symptoms was 2 weeks ago, and has been gradually worsening since that time. Treatment to date: cough suppressants and decongestants. Has been using albuterol inhaler due to wheezing and SOB.  The following portions of the patient's history were reviewed and updated as appropriate: allergies, current medications, past family history, past medical history, past social history, past surgical history and problem list.  Review of Systems Pertinent items are noted in HPI.   Objective:    BP 100/76   Pulse 80   Temp 98.2 F (36.8 C) (Oral)   Resp 14   Ht 5\' 3"  (1.6 m)   Wt 148 lb (67.1 kg)   SpO2 96%   BMI 26.22 kg/m  General appearance: alert, cooperative and appears stated age Head: Normocephalic, without obvious abnormality, atraumatic Ears: normal TM's and external ear canals both ears Nose: turbinates red, swollen, sinus tenderness bilateral Throat: lips, mucosa, and tongue normal; teeth and gums normal Lungs: wheezes LLL, LUL, RLL, RML and RUL Heart: regular rate and rhythm, S1, S2 normal, no murmur, click, rub or gallop Lymph nodes: Cervical, supraclavicular, and axillary nodes normal.   Assessment:  1. Acute bacterial sinusitis 2. Wheezing  Plan:   Rx Doxycycline.  Increase fluids.  Rest.  Saline nasal spray.  Probiotic.  Mucinex as directed.  Humidifier in bedroom. Prednisone 40 mg daily x 5 days. Rx tussionex. Duoneb given in office.  Call or return to clinic if symptoms are not improving.

## 2018-01-04 NOTE — Patient Instructions (Signed)
Please take antibiotic as directed.  Increase fluid intake.  Use Saline nasal spray.  Take a daily multivitamin. Use the prednisone and cough medication as directed.  Place a humidifier in the bedroom.  Please call or return clinic if symptoms are not improving.  Sinusitis Sinusitis is redness, soreness, and swelling (inflammation) of the paranasal sinuses. Paranasal sinuses are air pockets within the bones of your face (beneath the eyes, the middle of the forehead, or above the eyes). In healthy paranasal sinuses, mucus is able to drain out, and air is able to circulate through them by way of your nose. However, when your paranasal sinuses are inflamed, mucus and air can become trapped. This can allow bacteria and other germs to grow and cause infection. Sinusitis can develop quickly and last only a short time (acute) or continue over a long period (chronic). Sinusitis that lasts for more than 12 weeks is considered chronic.  CAUSES  Causes of sinusitis include:  Allergies.  Structural abnormalities, such as displacement of the cartilage that separates your nostrils (deviated septum), which can decrease the air flow through your nose and sinuses and affect sinus drainage.  Functional abnormalities, such as when the small hairs (cilia) that line your sinuses and help remove mucus do not work properly or are not present. SYMPTOMS  Symptoms of acute and chronic sinusitis are the same. The primary symptoms are pain and pressure around the affected sinuses. Other symptoms include:  Upper toothache.  Earache.  Headache.  Bad breath.  Decreased sense of smell and taste.  A cough, which worsens when you are lying flat.  Fatigue.  Fever.  Thick drainage from your nose, which often is green and may contain pus (purulent).  Swelling and warmth over the affected sinuses. DIAGNOSIS  Your caregiver will perform a physical exam. During the exam, your caregiver may:  Look in your nose for  signs of abnormal growths in your nostrils (nasal polyps).  Tap over the affected sinus to check for signs of infection.  View the inside of your sinuses (endoscopy) with a special imaging device with a light attached (endoscope), which is inserted into your sinuses. If your caregiver suspects that you have chronic sinusitis, one or more of the following tests may be recommended:  Allergy tests.  Nasal culture A sample of mucus is taken from your nose and sent to a lab and screened for bacteria.  Nasal cytology A sample of mucus is taken from your nose and examined by your caregiver to determine if your sinusitis is related to an allergy. TREATMENT  Most cases of acute sinusitis are related to a viral infection and will resolve on their own within 10 days. Sometimes medicines are prescribed to help relieve symptoms (pain medicine, decongestants, nasal steroid sprays, or saline sprays).  However, for sinusitis related to a bacterial infection, your caregiver will prescribe antibiotic medicines. These are medicines that will help kill the bacteria causing the infection.  Rarely, sinusitis is caused by a fungal infection. In theses cases, your caregiver will prescribe antifungal medicine. For some cases of chronic sinusitis, surgery is needed. Generally, these are cases in which sinusitis recurs more than 3 times per year, despite other treatments. HOME CARE INSTRUCTIONS   Drink plenty of water. Water helps thin the mucus so your sinuses can drain more easily.  Use a humidifier.  Inhale steam 3 to 4 times a day (for example, sit in the bathroom with the shower running).  Apply a warm, moist washcloth to  your face 3 to 4 times a day, or as directed by your caregiver.  Use saline nasal sprays to help moisten and clean your sinuses.  Take over-the-counter or prescription medicines for pain, discomfort, or fever only as directed by your caregiver. SEEK IMMEDIATE MEDICAL CARE IF:  You have  increasing pain or severe headaches.  You have nausea, vomiting, or drowsiness.  You have swelling around your face.  You have vision problems.  You have a stiff neck.  You have difficulty breathing. MAKE SURE YOU:   Understand these instructions.  Will watch your condition.  Will get help right away if you are not doing well or get worse. Document Released: 01/30/2005 Document Revised: 04/24/2011 Document Reviewed: 02/14/2011 [facility] Patient Information 2014 [place], [place].

## 2018-01-31 ENCOUNTER — Other Ambulatory Visit: Payer: Self-pay | Admitting: Physician Assistant

## 2018-02-22 ENCOUNTER — Other Ambulatory Visit: Payer: Self-pay | Admitting: Physician Assistant

## 2018-02-23 ENCOUNTER — Other Ambulatory Visit: Payer: Self-pay | Admitting: Physician Assistant

## 2018-03-08 LAB — HM PAP SMEAR: HM Pap smear: NORMAL

## 2018-03-21 ENCOUNTER — Other Ambulatory Visit: Payer: Self-pay | Admitting: Physician Assistant

## 2018-04-09 ENCOUNTER — Other Ambulatory Visit: Payer: Self-pay

## 2018-04-09 ENCOUNTER — Encounter: Payer: Self-pay | Admitting: Physician Assistant

## 2018-04-09 ENCOUNTER — Ambulatory Visit (INDEPENDENT_AMBULATORY_CARE_PROVIDER_SITE_OTHER): Payer: 59 | Admitting: Physician Assistant

## 2018-04-09 VITALS — BP 102/64 | HR 89 | Temp 98.3°F | Resp 14 | Ht 63.0 in | Wt 145.0 lb

## 2018-04-09 DIAGNOSIS — J019 Acute sinusitis, unspecified: Secondary | ICD-10-CM | POA: Diagnosis not present

## 2018-04-09 DIAGNOSIS — B9689 Other specified bacterial agents as the cause of diseases classified elsewhere: Secondary | ICD-10-CM | POA: Diagnosis not present

## 2018-04-09 MED ORDER — DOXYCYCLINE HYCLATE 100 MG PO CAPS: 100.0000 mg | ORAL_CAPSULE | Freq: Two times a day (BID) | ORAL | 0 refills | Status: DC

## 2018-04-09 NOTE — Progress Notes (Signed)
Patient presents to clinic today c/o 3 weeks of sinus pressure, nasal congestion, chest congestion, sinus pain and L ear pain. Denies chest pain and shortness of breath. Denies recent travel. Both of her daughters have had strep throat recently. She notes mild scratchy throat but no severe sore throat or fever.   Past Medical History:  Diagnosis Date  . Allergy   . Asthma   . Frequent headaches   . GERD (gastroesophageal reflux disease)   . IBS (irritable bowel syndrome)   . Migraines     Current Outpatient Medications on File Prior to Visit  Medication Sig Dispense Refill  . Ascorbic Acid (VITAMIN C) 100 MG tablet Take 250 mg by mouth daily.     . Aspirin-Acetaminophen-Caffeine (EXCEDRIN PO) Take by mouth as needed.    Marland Kitchen azelastine (ASTELIN) 0.1 % nasal spray Place 2 sprays into both nostrils 2 (two) times daily. Use in each nostril as directed 30 mL 3  . BIOTIN PO Take 1 tablet by mouth daily.     . Calcium-Vitamins C & D (CALCIUM/C/D PO) Take 1,000 mg by mouth daily.     . cetirizine (ZYRTEC) 10 MG tablet Take 10 mg by mouth daily.    . Cholecalciferol (VITAMIN D3) 5000 units CAPS Take 1 capsule by mouth daily.    Marland Kitchen gabapentin (NEURONTIN) 300 MG capsule TAKE 1 CAPSULE(300 MG) BY MOUTH TWICE DAILY 60 capsule 3  . Norethindrone-Ethinyl Estradiol-Fe Biphas (LO LOESTRIN FE) 1 MG-10 MCG / 10 MCG tablet Take 1 tablet by mouth daily.    . VENTOLIN HFA 108 (90 Base) MCG/ACT inhaler INHALE 2 PUFFS INTO THE LUNGS EVERY 6 HOURS AS NEEDED FOR WHEEZING OR SHORTNESS OF BREATH 18 g 0  . fluticasone (FLONASE) 50 MCG/ACT nasal spray Place into both nostrils daily.     No current facility-administered medications on file prior to visit.     Allergies  Allergen Reactions  . Peanut-Containing Drug Products     Allergic to all nuts  . Wasp Venom Anaphylaxis and Swelling  . Latex Swelling  . Penicillins Other (See Comments)    Reaction:  High fever    Family History  Problem Relation Age of  Onset  . Cancer Maternal Aunt   . Osteoporosis Maternal Grandmother   . Arthritis Maternal Grandmother   . Birth defects Maternal Grandmother   . Cancer Maternal Grandmother   . Hyperlipidemia Maternal Grandmother   . Hyperlipidemia Mother   . Diabetes Father   . Hyperlipidemia Father   . Hyperlipidemia Brother   . Arthritis Paternal Grandmother   . Asthma Paternal Grandmother   . Cancer Paternal Grandmother   . Hearing loss Paternal Grandmother   . Hyperlipidemia Paternal Grandmother   . Hypertension Paternal Grandmother     Social History   Socioeconomic History  . Marital status: Married    Spouse name: Not on file  . Number of children: Not on file  . Years of education: Not on file  . Highest education level: Not on file  Occupational History  . Not on file  Social Needs  . Financial resource strain: Not on file  . Food insecurity:    Worry: Not on file    Inability: Not on file  . Transportation needs:    Medical: Not on file    Non-medical: Not on file  Tobacco Use  . Smoking status: Former Smoker    Packs/day: 0.50    Years: 9.00    Pack years: 4.50  Types: Cigarettes    Last attempt to quit: 10/13/2011    Years since quitting: 6.4  . Smokeless tobacco: Never Used  Substance and Sexual Activity  . Alcohol use: Yes    Comment: 5 drinks/month  . Drug use: No  . Sexual activity: Yes    Partners: Male    Birth control/protection: None  Lifestyle  . Physical activity:    Days per week: Not on file    Minutes per session: Not on file  . Stress: Not on file  Relationships  . Social connections:    Talks on phone: Not on file    Gets together: Not on file    Attends religious service: Not on file    Active member of club or organization: Not on file    Attends meetings of clubs or organizations: Not on file    Relationship status: Not on file  Other Topics Concern  . Not on file  Social History Narrative  . Not on file   Review of Systems - See  HPI.  All other ROS are negative.  BP 102/64   Pulse 89   Temp 98.3 F (36.8 C) (Oral)   Resp 14   Ht 5\' 3"  (1.6 m)   Wt 145 lb (65.8 kg)   SpO2 98%   BMI 25.69 kg/m   Physical Exam Vitals signs reviewed.  Constitutional:      Appearance: Normal appearance.  HENT:     Head: Normocephalic and atraumatic.     Right Ear: Tympanic membrane normal.     Left Ear: Tympanic membrane normal.     Nose: Congestion and rhinorrhea present.     Right Turbinates: Enlarged and swollen.     Left Turbinates: Enlarged and swollen.     Right Sinus: Frontal sinus tenderness present.     Left Sinus: Frontal sinus tenderness present.     Mouth/Throat:     Mouth: Mucous membranes are moist.     Pharynx: Oropharynx is clear. No oropharyngeal exudate or posterior oropharyngeal erythema.  Eyes:     Conjunctiva/sclera: Conjunctivae normal.  Neck:     Musculoskeletal: Neck supple.  Cardiovascular:     Rate and Rhythm: Normal rate and regular rhythm.     Pulses: Normal pulses.     Heart sounds: Normal heart sounds.  Pulmonary:     Effort: Pulmonary effort is normal.     Breath sounds: Normal breath sounds.  Lymphadenopathy:     Cervical: No cervical adenopathy.  Neurological:     Mental Status: She is alert.  Psychiatric:        Mood and Affect: Mood normal.     Assessment/Plan: 1. Acute bacterial sinusitis Rx Doxycycline.  Increase fluids.  Rest.  Saline nasal spray.  Probiotic.  Mucinex as directed.  Humidifier in bedroom.  Call or return to clinic if symptoms are not improving.    Piedad Climes, PA-C

## 2018-04-09 NOTE — Patient Instructions (Signed)
Please take antibiotic as directed.  Increase fluid intake.  Use Saline nasal spray.  Take a daily multivitamin. Continue Flonase. Start plain Mucinex.  Place a humidifier in the bedroom.  Please call or return clinic if symptoms are not improving.  Sinusitis Sinusitis is redness, soreness, and swelling (inflammation) of the paranasal sinuses. Paranasal sinuses are air pockets within the bones of your face (beneath the eyes, the middle of the forehead, or above the eyes). In healthy paranasal sinuses, mucus is able to drain out, and air is able to circulate through them by way of your nose. However, when your paranasal sinuses are inflamed, mucus and air can become trapped. This can allow bacteria and other germs to grow and cause infection. Sinusitis can develop quickly and last only a short time (acute) or continue over a long period (chronic). Sinusitis that lasts for more than 12 weeks is considered chronic.  CAUSES  Causes of sinusitis include:  Allergies.  Structural abnormalities, such as displacement of the cartilage that separates your nostrils (deviated septum), which can decrease the air flow through your nose and sinuses and affect sinus drainage.  Functional abnormalities, such as when the small hairs (cilia) that line your sinuses and help remove mucus do not work properly or are not present. SYMPTOMS  Symptoms of acute and chronic sinusitis are the same. The primary symptoms are pain and pressure around the affected sinuses. Other symptoms include:  Upper toothache.  Earache.  Headache.  Bad breath.  Decreased sense of smell and taste.  A cough, which worsens when you are lying flat.  Fatigue.  Fever.  Thick drainage from your nose, which often is green and may contain pus (purulent).  Swelling and warmth over the affected sinuses. DIAGNOSIS  Your caregiver will perform a physical exam. During the exam, your caregiver may:  Look in your nose for signs of abnormal  growths in your nostrils (nasal polyps).  Tap over the affected sinus to check for signs of infection.  View the inside of your sinuses (endoscopy) with a special imaging device with a light attached (endoscope), which is inserted into your sinuses. If your caregiver suspects that you have chronic sinusitis, one or more of the following tests may be recommended:  Allergy tests.  Nasal culture A sample of mucus is taken from your nose and sent to a lab and screened for bacteria.  Nasal cytology A sample of mucus is taken from your nose and examined by your caregiver to determine if your sinusitis is related to an allergy. TREATMENT  Most cases of acute sinusitis are related to a viral infection and will resolve on their own within 10 days. Sometimes medicines are prescribed to help relieve symptoms (pain medicine, decongestants, nasal steroid sprays, or saline sprays).  However, for sinusitis related to a bacterial infection, your caregiver will prescribe antibiotic medicines. These are medicines that will help kill the bacteria causing the infection.  Rarely, sinusitis is caused by a fungal infection. In theses cases, your caregiver will prescribe antifungal medicine. For some cases of chronic sinusitis, surgery is needed. Generally, these are cases in which sinusitis recurs more than 3 times per year, despite other treatments. HOME CARE INSTRUCTIONS   Drink plenty of water. Water helps thin the mucus so your sinuses can drain more easily.  Use a humidifier.  Inhale steam 3 to 4 times a day (for example, sit in the bathroom with the shower running).  Apply a warm, moist washcloth to your face 3  to 4 times a day, or as directed by your caregiver.  Use saline nasal sprays to help moisten and clean your sinuses.  Take over-the-counter or prescription medicines for pain, discomfort, or fever only as directed by your caregiver. SEEK IMMEDIATE MEDICAL CARE IF:  You have increasing pain or  severe headaches.  You have nausea, vomiting, or drowsiness.  You have swelling around your face.  You have vision problems.  You have a stiff neck.  You have difficulty breathing. MAKE SURE YOU:   Understand these instructions.  Will watch your condition.  Will get help right away if you are not doing well or get worse. Document Released: 01/30/2005 Document Revised: 04/24/2011 Document Reviewed: 02/14/2011 [employer] Patient Information 2014 [employer]   

## 2018-04-21 ENCOUNTER — Other Ambulatory Visit: Payer: Self-pay | Admitting: Physician Assistant

## 2018-04-28 ENCOUNTER — Other Ambulatory Visit: Payer: Self-pay

## 2018-04-28 ENCOUNTER — Emergency Department (HOSPITAL_BASED_OUTPATIENT_CLINIC_OR_DEPARTMENT_OTHER)
Admission: EM | Admit: 2018-04-28 | Discharge: 2018-04-28 | Disposition: A | Discharge status: Home / Self Care | Payer: 59 | Attending: Emergency Medicine | Admitting: Emergency Medicine

## 2018-04-28 ENCOUNTER — Encounter (HOSPITAL_BASED_OUTPATIENT_CLINIC_OR_DEPARTMENT_OTHER): Payer: Self-pay | Admitting: Emergency Medicine

## 2018-04-28 DIAGNOSIS — Z87891 Personal history of nicotine dependence: Secondary | ICD-10-CM | POA: Insufficient documentation

## 2018-04-28 DIAGNOSIS — J45909 Unspecified asthma, uncomplicated: Secondary | ICD-10-CM | POA: Diagnosis not present

## 2018-04-28 DIAGNOSIS — M545 Low back pain, unspecified: Secondary | ICD-10-CM

## 2018-04-28 DIAGNOSIS — Z79899 Other long term (current) drug therapy: Secondary | ICD-10-CM | POA: Diagnosis not present

## 2018-04-28 MED ORDER — METHOCARBAMOL 500 MG PO TABS: 500.0000 mg | ORAL_TABLET | Freq: Three times a day (TID) | ORAL | 0 refills | Status: DC | PRN

## 2018-04-28 MED ORDER — MELOXICAM 15 MG PO TABS: 15.0000 mg | ORAL_TABLET | Freq: Every day | ORAL | 0 refills | Status: DC

## 2018-04-28 MED ORDER — LIDOCAINE 5 % EX PTCH: 1.0000 | MEDICATED_PATCH | CUTANEOUS | 0 refills | Status: DC

## 2018-04-28 NOTE — ED Provider Notes (Signed)
[facility] EMERGENCY DEPARTMENT Provider Note   CSN: [id number] Arrival date & time: 04/28/18  1306    History   Chief Complaint Chief Complaint  Patient presents with  . Back Pain    HPI Allison Kane is a 33 y.o. female with a hx of asthma, GERD, IBS, and migraines who presents to the ER with complaints of back pain that began a few days ago after bending over/standing up. Patient states pain is located primarily in the right lower back but does radiate to midline. Pain is constant, worse with movement/certain positions, no alleviating factors. Tried aleve without relief. Denies numbness, tingling, weakness, saddle anesthesia, incontinence to bowel/bladder, fever, chills, IV drug use, dysuria, or hx of cancer. Patient has not had prior back surgeries, does have known herniated disc.       HPI  Past Medical History:  Diagnosis Date  . Allergy   . Asthma   . Frequent headaches   . GERD (gastroesophageal reflux disease)   . IBS (irritable bowel syndrome)   . Migraines     Patient Active Problem List   Diagnosis Date Noted  . Chronic low back pain 12/10/2017  . Ingrown toenail 06/26/2017  . History of abnormal cervical Papanicolaou smear 05/04/2017  . Nasal septal deviation 05/04/2015  . Asthma 03/23/2015  . GERD without esophagitis 03/23/2015  . IBS (irritable bowel syndrome) 03/23/2015  . S/P cesarean section 01/12/2014  . History of abnormal Pap smear 09/09/2012    Past Surgical History:  Procedure Laterality Date  . CERVICAL BIOPSY  W/ LOOP ELECTRODE EXCISION  2011   HGSIL  . CESAREAN SECTION     2013  . CESAREAN SECTION N/A 01/12/2014   Procedure: CESAREAN SECTION;  Surgeon: Sherian Rein, MD;  Location: [facility];  Service: Obstetrics;  Laterality: N/A;     OB History    Gravida  2   Para  2   Term  2   Preterm      AB      Living  2     SAB      TAB      Ectopic      Multiple  0   Live Births  2             Home Medications    Prior to Admission medications   Medication Sig Start Date End Date Taking? Authorizing Provider  albuterol (PROVENTIL HFA;VENTOLIN HFA) 108 (90 Base) MCG/ACT inhaler INHALE 2 PUFFS INTO THE LUNGS EVERY 6 HOURS AS NEEDED FOR WHEEZING OR SHORTNESS OF BREATH 04/22/18   Waldon Merl, PA-C  Ascorbic Acid (VITAMIN C) 100 MG tablet Take 250 mg by mouth daily.  03/23/15   [provider]  Aspirin-Acetaminophen-Caffeine (EXCEDRIN PO) Take by mouth as needed.    [provider]  azelastine (ASTELIN) 0.1 % nasal spray Place 2 sprays into both nostrils 2 (two) times daily. Use in each nostril as directed 12/11/17   Waldon Merl, PA-C  BIOTIN PO Take 1 tablet by mouth daily.     [provider]  Calcium-Vitamins C & D (CALCIUM/C/D PO) Take 1,000 mg by mouth daily.     [provider]  cetirizine (ZYRTEC) 10 MG tablet Take 10 mg by mouth daily.    [provider]  Cholecalciferol (VITAMIN D3) 5000 units CAPS Take 1 capsule by mouth daily.    [provider]  doxycycline (VIBRAMYCIN) 100 MG capsule Take 1 capsule (100 mg total) by  mouth 2 (two) times daily. 04/09/18   Waldon Merl, PA-C  fluticasone (FLONASE) 50 MCG/ACT nasal spray Place into both nostrils daily.    [provider]  gabapentin (NEURONTIN) 300 MG capsule TAKE 1 CAPSULE(300 MG) BY MOUTH TWICE DAILY 02/25/18   Waldon Merl, PA-C  Norethindrone-Ethinyl Estradiol-Fe Biphas (LO LOESTRIN FE) 1 MG-10 MCG / 10 MCG tablet Take 1 tablet by mouth daily.    [provider]    Family History Family History  Problem Relation Age of Onset  . Cancer Maternal Aunt   . Osteoporosis Maternal Grandmother   . Arthritis Maternal Grandmother   . Birth defects Maternal Grandmother   . Cancer Maternal Grandmother   . Hyperlipidemia Maternal Grandmother   . Hyperlipidemia Mother   . Diabetes Father   . Hyperlipidemia Father   . Hyperlipidemia Brother   .  Arthritis Paternal Grandmother   . Asthma Paternal Grandmother   . Cancer Paternal Grandmother   . Hearing loss Paternal Grandmother   . Hyperlipidemia Paternal Grandmother   . Hypertension Paternal Grandmother     Social History Social History   Tobacco Use  . Smoking status: Former Smoker    Packs/day: 0.50    Years: 9.00    Pack years: 4.50    Types: Cigarettes    Last attempt to quit: 10/13/2011    Years since quitting: 6.5  . Smokeless tobacco: Never Used  Substance Use Topics  . Alcohol use: Yes    Comment: 5 drinks/month  . Drug use: No     Allergies   Peanut-containing drug products; Wasp venom; Latex; and Penicillins   Review of Systems Review of Systems  Constitutional: Negative for chills and fever.  Gastrointestinal: Negative for abdominal pain.  Genitourinary: Negative for dysuria.  Musculoskeletal: Positive for back pain.  Neurological: Negative for weakness and numbness.       Negative for incontinence. Negative for saddle anesthesia.      Physical Exam Updated Vital Signs BP 115/81 (BP Location: Left Arm)   Pulse 71   Temp 98.2 F (36.8 C) (Oral)   Resp 18   Ht 5\' 3"  (1.6 m)   Wt 65.3 kg   LMP 04/14/2018   SpO2 100%   BMI 25.51 kg/m   Physical Exam Constitutional:      General: She is not in acute distress.    Appearance: She is well-developed. She is not toxic-appearing.  HENT:     Head: Normocephalic and atraumatic.  Neck:     Musculoskeletal: Normal range of motion and neck supple. No spinous process tenderness or muscular tenderness.  Cardiovascular:     Rate and Rhythm: Normal rate.  Pulmonary:     Effort: Pulmonary effort is normal.  Musculoskeletal:     Comments: No obvious deformity, appreciable swelling, erythema, ecchymosis, significant open wounds, or increased warmth.  Extremities: Normal ROM. Nontender.  Back: No point/focal vertebral tenderness, no palpable step off or crepitus. Tenderness to Right lumbar paraspinal  muscles which extends to R gluteal area w/ diffuse midline lumbar tenderness.   Skin:    General: Skin is warm and dry.     Findings: No rash.  Neurological:     Mental Status: She is alert.     Deep Tendon Reflexes:     Reflex Scores:      Patellar reflexes are 2+ on the right side and 2+ on the left side.    Comments: Sensation grossly intact to bilateral lower extremities. 5/5 symmetric strength  with plantar/dorsiflexion bilaterally. Gait is intact without obvious foot drop.       ED Treatments / Results  Labs (all labs ordered are listed, but only abnormal results are displayed) Labs Reviewed - No data to display  EKG None  Radiology No results found.  Procedures Procedures (including critical care time)  Medications Ordered in ED Medications - No data to display   Initial Impression / Assessment and Plan / ED Course  I have reviewed the triage vital signs and the nursing notes.  Pertinent labs & imaging results that were available during my care of the patient were reviewed by me and considered in my medical decision making (see chart for details).    Patient presents with complaint of back pain.  Patient is nontoxic appearing, vitals are WNL. Patient has normal neurologic exam, no point/focal midline tenderness to palpation. She is ambulatory in the ED.  No back pain red flags. No urinary sxs. Most likely muscle strain versus spasm. Considered UTI/pyelonephritis, kidney stone, aortic aneurysm/dissection, cauda equina or epidural abscess however these do not fit clinical picture at this time. Will treat with Mobic, Lidoderm patches, and Robaxin, discussed with patient that they are not to drive or operate heavy machinery while taking Robaxin. I discussed treatment plan, need for PCP follow-up, and return precautions with the patient. Provided opportunity for questions, patient confirmed understanding and is in agreement with plan.   Final Clinical Impressions(s) / ED  Diagnoses   Final diagnoses:  Acute right-sided low back pain without sciatica    ED Discharge Orders         Ordered    meloxicam (MOBIC) 15 MG tablet  Daily     04/28/18 1405    methocarbamol (ROBAXIN) 500 MG tablet  Every 8 hours PRN     04/28/18 1405    lidocaine (LIDODERM) 5 %  Every 24 hours     04/28/18 1405           Sharona Rovner, [place] R, PA-C 04/28/18 1422    Cathren Laine, MD 04/30/18 1542

## 2018-04-28 NOTE — ED Triage Notes (Signed)
Pt c/o low back pain that radiates to mid back onset yesterday morning while feeding her dogs.

## 2018-04-28 NOTE — Discharge Instructions (Addendum)
You were seen in the emergency department for back pain today.  At this time we suspect that your pain is related to a muscle strain/spasm.   I have prescribed you an anti-inflammatory medication and a muscle relaxer.  - Mobic is a nonsteroidal anti-inflammatory medication that will help with pain and swelling. Be sure to take this medication as prescribed with food, 1 pill every 24 hours,  It should be taken with food, as it can cause stomach upset, and more seriously, stomach bleeding. Do not take other nonsteroidal anti-inflammatory medications with this such as Advil, Motrin, Aleve, naproxen, Goodie Powder, or Motrin.    - Robaxin is the muscle relaxer I have prescribed, this is meant to help with muscle tightness. Be aware that this medication may make you drowsy therefore the first time you take this it should be at a time you are in an environment where you can rest. Do not drive or operate heavy machinery when taking this medication. Do not drink alcohol or take other sedating medications with this medicine such as narcotics or benzodiazepines.   - Lidoderm patch: apply 1 per day to area of discomfort.   You make take Tylenol per over the counter dosing with these medications.   We have prescribed you new medication(s) today. Discuss the medications prescribed today with your pharmacist as they can have adverse effects and interactions with your other medicines including over the counter and prescribed medications. Seek medical evaluation if you start to experience new or abnormal symptoms after taking one of these medicines, seek care immediately if you start to experience difficulty breathing, feeling of your throat closing, facial swelling, or rash as these could be indications of a more serious allergic reaction   The application of heat can help soothe the pain.  Maintaining your daily activities, including walking, is encourged, as it will help you get better faster than just staying in  bed.  Follow up with your orthopedic doctor within 2-5 days. However return to the ER should you develop ne or worsening symptoms or any other concerns including but not limited to severe or worsening pain, low back pain with fever, numbness, weakness, loss of bowel or bladder control, or inability to walk or urinate, you should return to the ER immediately.

## 2018-04-29 ENCOUNTER — Encounter (INDEPENDENT_AMBULATORY_CARE_PROVIDER_SITE_OTHER): Payer: Self-pay | Admitting: Family Medicine

## 2018-04-29 ENCOUNTER — Ambulatory Visit (INDEPENDENT_AMBULATORY_CARE_PROVIDER_SITE_OTHER): Payer: 59 | Admitting: Family Medicine

## 2018-04-29 DIAGNOSIS — M5441 Lumbago with sciatica, right side: Secondary | ICD-10-CM | POA: Diagnosis not present

## 2018-04-29 MED ORDER — TRAMADOL HCL 50 MG PO TABS: 50.0000 mg | ORAL_TABLET | Freq: Four times a day (QID) | ORAL | 0 refills | Status: DC | PRN

## 2018-04-29 MED ORDER — BACLOFEN 10 MG PO TABS: 10.0000 mg | ORAL_TABLET | Freq: Three times a day (TID) | ORAL | 3 refills | Status: DC | PRN

## 2018-04-29 MED ORDER — PREDNISONE 10 MG PO TABS: ORAL_TABLET | ORAL | 0 refills | Status: DC

## 2018-04-29 NOTE — Progress Notes (Signed)
Office Visit Note   Patient: Allison Kane           Date of Birth: 01-08-1986           MRN: [id number] Visit Date: 04/29/2018 Requested by: Waldon Merl, PA-C [address] [place], [place] [address] PCP: Noel Journey  Subjective: Chief Complaint  Patient presents with  . Lower Back - Pain    Bent over to pick up the dogs' food bowls and has had pain since then. Pain is worse on the right side - radiates a little into the thigh. Went to ED ([facility]).     HPI: She is a 33 year old with low back and right leg pain.  She has had intermittent pain for the past couple years, but recently she bent to pick up a dog's food bowl and has had increased pain since then.  Pain radiates into the right thigh.  She went to hospital yesterday and x-rays were not obtained, she had some last here as well as an MRI scan which showed L5-S1 disc protrusion.  She was given some medications and now presents for evaluation.  She has been intermittently seeing Dr. Lianne Cure for chiropractic treatments over the past couple years.  It does not seem to be helping with her current flareup.              ROS: Denies fevers, chills, night sweats, unintentional weight change, bowel or bladder dysfunction.  All other systems were reviewed and are negative.  Objective: Vital Signs: LMP 04/14/2018   Physical Exam:  General:  Alert and oriented, in no acute distress. Pulm:  Breathing unlabored. Psy:  Normal mood, congruent affect. Skin: No rash on her skin. Low back: Tender to palpation midline L5-S1 area.  Tender in both gluteus medius areas.  Straight leg raises positive on the right, cross positive on the left.  Very tight bilateral hamstrings.  5/5 hip flexion, knee extension, foot dorsiflexion, eversion, ankle plantarflexion and great toe extension strength bilaterally.  1+ knee and ankle DTRs.  Imaging: None today.  Assessment & Plan: 1.  Flareup low back pain with sciatica, most  likely due to L5-S1 disc protrusion.  Neurologic exam is nonfocal. -Physical therapy at [facility] in [place]. -Prednisone taper, baclofen as needed, tramadol as needed. -Consider lumbar epidural injection if symptoms worsen.    Procedures: No procedures performed  No notes on file     PMFS History: Patient Active Problem List   Diagnosis Date Noted  . Chronic low back pain 12/10/2017  . Ingrown toenail 06/26/2017  . History of abnormal cervical Papanicolaou smear 05/04/2017  . Nasal septal deviation 05/04/2015  . Asthma 03/23/2015  . GERD without esophagitis 03/23/2015  . IBS (irritable bowel syndrome) 03/23/2015  . S/P cesarean section 01/12/2014  . History of abnormal Pap smear 09/09/2012   Past Medical History:  Diagnosis Date  . Allergy   . Asthma   . Frequent headaches   . GERD (gastroesophageal reflux disease)   . IBS (irritable bowel syndrome)   . Migraines     Family History  Problem Relation Age of Onset  . Cancer Maternal Aunt   . Osteoporosis Maternal Grandmother   . Arthritis Maternal Grandmother   . Birth defects Maternal Grandmother   . Cancer Maternal Grandmother   . Hyperlipidemia Maternal Grandmother   . Hyperlipidemia Mother   . Diabetes Father   . Hyperlipidemia Father   . Hyperlipidemia Brother   .  Arthritis Paternal Grandmother   . Asthma Paternal Grandmother   . Cancer Paternal Grandmother   . Hearing loss Paternal Grandmother   . Hyperlipidemia Paternal Grandmother   . Hypertension Paternal Grandmother     Past Surgical History:  Procedure Laterality Date  . CERVICAL BIOPSY  W/ LOOP ELECTRODE EXCISION  2011   HGSIL  . CESAREAN SECTION     2013  . CESAREAN SECTION N/A 01/12/2014   Procedure: CESAREAN SECTION;  Surgeon: Sherian Rein, MD;  Location: [facility];  Service: Obstetrics;  Laterality: N/A;   Social History   Occupational History  . Not on file  Tobacco Use  . Smoking status: Former Smoker    Packs/day: 0.50     Years: 9.00    Pack years: 4.50    Types: Cigarettes    Last attempt to quit: 10/13/2011    Years since quitting: 6.5  . Smokeless tobacco: Never Used  Substance and Sexual Activity  . Alcohol use: Yes    Comment: 5 drinks/month  . Drug use: No  . Sexual activity: Yes    Partners: Male    Birth control/protection: None

## 2018-05-06 ENCOUNTER — Telehealth (INDEPENDENT_AMBULATORY_CARE_PROVIDER_SITE_OTHER): Payer: Self-pay | Admitting: Family Medicine

## 2018-05-06 NOTE — Telephone Encounter (Signed)
L-spine mri report faxed to [facility] [phone], Tammy [phone]

## 2018-05-09 ENCOUNTER — Encounter: Payer: Self-pay | Admitting: Emergency Medicine

## 2018-06-23 ENCOUNTER — Other Ambulatory Visit: Payer: Self-pay | Admitting: Physician Assistant

## 2018-06-24 ENCOUNTER — Encounter: Payer: Self-pay | Admitting: Emergency Medicine

## 2018-06-24 NOTE — Telephone Encounter (Signed)
Patient due for 6 month follow up on back pain. My chart message sent to patient

## 2018-07-24 ENCOUNTER — Ambulatory Visit (INDEPENDENT_AMBULATORY_CARE_PROVIDER_SITE_OTHER): Payer: 59 | Admitting: Family Medicine

## 2018-07-24 ENCOUNTER — Encounter: Payer: Self-pay | Admitting: Family Medicine

## 2018-07-24 ENCOUNTER — Other Ambulatory Visit: Payer: Self-pay

## 2018-07-24 DIAGNOSIS — G8929 Other chronic pain: Secondary | ICD-10-CM

## 2018-07-24 DIAGNOSIS — M5441 Lumbago with sciatica, right side: Secondary | ICD-10-CM

## 2018-07-24 MED ORDER — TIZANIDINE HCL 2 MG PO TABS: 2.0000 mg | ORAL_TABLET | Freq: Four times a day (QID) | ORAL | 1 refills | Status: DC | PRN

## 2018-07-24 NOTE — Progress Notes (Signed)
Office Visit Note   Patient: Allison Kane           Date of Birth: 1985/08/28           MRN: [id number] Visit Date: 07/24/2018 Requested by: Brunetta Jeans, PA-C [address] [place], [place] [address] PCP: Delorse Limber  Subjective: Chief Complaint  Patient presents with  . Lower Back - Pain    Pain has gotten worse. 3 weeks post PT now (insurance only covered 8 visits). Pain is now constant in back and right leg, with numbness in the foot.    HPI: She is here with worsening low back and right leg pain.  She finished physical therapy and she is doing the home exercises, she thinks her pain is gotten worse.  Now she has tingling on top of her foot.  She has not noticed any weakness in her leg, no bowel or bladder dysfunction.  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  Gabapentin does not seem to make much difference.              ROS: No fevers or chills.  All other systems were reviewed and are negative.  Objective: Vital Signs: There were no vitals taken for this visit.  Physical Exam:  General:  Alert and oriented, in no acute distress. Pulm:  Breathing unlabored. Psy:  Normal mood, congruent affect. Skin: No rash on her skin. Low back: Slightly tender over the L5-S1 level in the midline.  Moderate tenderness in the right sciatic notch, pain with straight leg raise but negative cross straight leg raise on the left.  Lower extremity strength and reflexes are still normal.  Imaging: None today.  Assessment & Plan: 1.  Worsening right-sided sciatica with history of L5-S1 disc protrusion per previous MRI scan -Discussed options with her and elected to try epidural steroid injection.  If still no improvement, could contemplate surgical consult but she would like to avoid that if possible. -Trial of Zanaflex as needed.     Procedures: No procedures performed  No notes on file     PMFS History: Patient Active  Problem List   Diagnosis Date Noted  . Chronic low back pain 12/10/2017  . Ingrown toenail 06/26/2017  . History of abnormal cervical Papanicolaou smear 05/04/2017  . Nasal septal deviation 05/04/2015  . Asthma 03/23/2015  . GERD without esophagitis 03/23/2015  . IBS (irritable bowel syndrome) 03/23/2015  . S/P cesarean section 01/12/2014  . History of abnormal Pap smear 09/09/2012   Past Medical History:  Diagnosis Date  . Allergy   . Asthma   . Frequent headaches   . GERD (gastroesophageal reflux disease)   . IBS (irritable bowel syndrome)   . Migraines     Family History  Problem Relation Age of Onset  . Cancer Maternal Aunt   . Osteoporosis Maternal Grandmother   . Arthritis Maternal Grandmother   . Birth defects Maternal Grandmother   . Cancer Maternal Grandmother   . Hyperlipidemia Maternal Grandmother   . Hyperlipidemia Mother   . Diabetes Father   . Hyperlipidemia Father   . Hyperlipidemia Brother   . Arthritis Paternal Grandmother   . Asthma Paternal Grandmother   . Cancer Paternal Grandmother   . Hearing loss Paternal Grandmother   . Hyperlipidemia Paternal Grandmother   . Hypertension Paternal Grandmother     Past Surgical History:  Procedure Laterality Date  . CERVICAL BIOPSY  W/ LOOP ELECTRODE EXCISION  2011   HGSIL  . CESAREAN SECTION     2013  . CESAREAN SECTION N/A 01/12/2014   Procedure: CESAREAN SECTION;  Surgeon: Sherian ReinJody Bovard-Stuckert, MD;  Location: [facility];  Service: Obstetrics;  Laterality: N/A;   Social History   Occupational History  . Not on file  Tobacco Use  . Smoking status: Former Smoker    Packs/day: 0.50    Years: 9.00    Pack years: 4.50    Types: Cigarettes    Last attempt to quit: 10/13/2011    Years since quitting: 6.7  . Smokeless tobacco: Never Used  Substance and Sexual Activity  . Alcohol use: Yes    Comment: 5 drinks/month  . Drug use: No  . Sexual activity: Yes    Partners: Male    Birth control/protection: None

## 2018-07-26 ENCOUNTER — Telehealth: Payer: Self-pay | Admitting: *Deleted

## 2018-08-12 ENCOUNTER — Other Ambulatory Visit: Payer: Self-pay | Admitting: Physician Assistant

## 2018-08-14 ENCOUNTER — Ambulatory Visit (INDEPENDENT_AMBULATORY_CARE_PROVIDER_SITE_OTHER): Payer: 59 | Admitting: Physical Medicine and Rehabilitation

## 2018-08-14 ENCOUNTER — Encounter: Payer: Self-pay | Admitting: Physical Medicine and Rehabilitation

## 2018-08-14 ENCOUNTER — Other Ambulatory Visit: Payer: Self-pay

## 2018-08-14 ENCOUNTER — Ambulatory Visit: Payer: Self-pay

## 2018-08-14 VITALS — BP 107/60 | HR 60

## 2018-08-14 DIAGNOSIS — M5416 Radiculopathy, lumbar region: Secondary | ICD-10-CM | POA: Diagnosis not present

## 2018-08-14 DIAGNOSIS — M5116 Intervertebral disc disorders with radiculopathy, lumbar region: Secondary | ICD-10-CM

## 2018-08-14 MED ORDER — BETAMETHASONE SOD PHOS & ACET 6 (3-3) MG/ML IJ SUSP
12.0000 mg | Freq: Once | INTRAMUSCULAR | Status: AC
  Administered 2018-08-14: 13:00:00 12 mg

## 2018-08-14 NOTE — Progress Notes (Signed)
.  Numeric Pain Rating Scale and Functional Assessment Average Pain 4   In the last MONTH (on 0-10 scale) has pain interfered with the following?  1. General activity like being  able to carry out your everyday physical activities such as walking, climbing stairs, carrying groceries, or moving a chair?  Rating(0)   +Driver, -BT, -Dye Allergies.  

## 2018-08-15 NOTE — Progress Notes (Signed)
Allison Kane - 33 y.o. female MRN [id number]  Date of birth: [birth year]  Office Visit Note: Visit Date: 08/14/2018 PCP: Brunetta Jeans, PA-C Referred by: Brunetta Jeans, PA-C  Subjective: Chief Complaint  Patient presents with  . Lower Back - Pain   HPI:  Allison Kane is a 33 y.o. female who comes in today At the request of Dr. Legrand Como hilts for right S1 transforaminal epidural steroid injection for pretty classic right S1 radiculitis and back pain with worsening and failure of conservative care.  Patient is seen Dr. Durward Fortes in the past MRI had been obtained did show small disc protrusion at L5-S1.  We actually saw her husband recently with similar problem and same injection.  ROS Otherwise per HPI.  Assessment & Plan: Visit Diagnoses:  1. Lumbar radiculopathy   2. Radiculopathy due to lumbar intervertebral disc disorder     Plan: No additional findings.   Meds & Orders:  Meds ordered this encounter  Medications  . betamethasone acetate-betamethasone sodium phosphate (CELESTONE) injection 12 mg    Orders Placed This Encounter  Procedures  . XR C-ARM NO REPORT  . Epidural Steroid injection    Follow-up: Return if symptoms worsen or fail to improve.   Procedures: No procedures performed  S1 Lumbosacral Transforaminal Epidural Steroid Injection - Sub-Pedicular Approach with Fluoroscopic Guidance   Patient: Allison Kane      Date of Birth: May 26, 1985 MRN: [id number] PCP: Brunetta Jeans, PA-C      Visit Date: 08/14/2018   Universal Protocol:    Date/Time: 07/02/206:11 AM  Consent Given By: the patient  Position:  PRONE  Additional Comments: Vital signs were monitored before and after the procedure. Patient was prepped and draped in the usual sterile fashion. The correct patient, procedure, and site was verified.   Injection Procedure Details:  Procedure Site One Meds Administered:  Meds ordered this encounter  Medications  .  betamethasone acetate-betamethasone sodium phosphate (CELESTONE) injection 12 mg    Laterality: Right  Location/Site:  S1 Foramen   Needle size: 22 ga.  Needle type: Spinal  Needle Placement: Transforaminal  Findings:   -Comments: Excellent flow of contrast along the nerve and into the epidural space.  Procedure Details: After squaring off the sacral end-plate to get a true AP view, the C-arm was positioned so that the best possible view of the S1 foramen was visualized. The soft tissues overlying this structure were infiltrated with 2-3 ml. of 1% Lidocaine without Epinephrine.    The spinal needle was inserted toward the target using a "trajectory" view along the fluoroscope beam.  Under AP and lateral visualization, the needle was advanced so it did not puncture dura. Biplanar projections were used to confirm position. Aspiration was confirmed to be negative for CSF and/or blood. A 1-2 ml. volume of Isovue-250 was injected and flow of contrast was noted at each level. Radiographs were obtained for documentation purposes.   After attaining the desired flow of contrast documented above, a 0.5 to 1.0 ml test dose of 0.25% Marcaine was injected into each respective transforaminal space.  The patient was observed for 90 seconds post injection.  After no sensory deficits were reported, and normal lower extremity motor function was noted,   the above injectate was administered so that equal amounts of the injectate were placed at each foramen (level) into the transforaminal epidural space.   Additional Comments:  No complications occurred Dressing: Band-Aid with 2 x 2 sterile gauze  Post-procedure details: Patient was observed during the procedure. Post-procedure instructions were reviewed.  Patient left the clinic in stable condition.    Clinical History: MRI LUMBAR SPINE WITHOUT CONTRAST  TECHNIQUE: Multiplanar, multisequence MR imaging of the lumbar spine was performed.  No intravenous contrast was administered.  COMPARISON:  Plain films lumbar spine 05/04/2017.  FINDINGS: Segmentation:  Standard.  Alignment:  Mild convex right scoliosis noted.  No listhesis.  Vertebrae: Height and signal are normal. No pars interarticularis defect.  Conus medullaris and cauda equina: Conus extends to the L1-2 level. Conus and cauda equina appear normal.  Paraspinal and other soft tissues: Negative.  Disc levels:  The T11-12 to L4-5 levels are negative.  L5-S1: There is a broad-based central and right side disc protrusion. The disc encroaches on the right S1 root and slightly indents the ventral thecal sac. The foramina are open.  IMPRESSION: Broad-based central and eccentric to the right protrusion at L5-S1 encroaches on the right S1 in the subarticular recesses slightly indents the ventral thecal sac.  Mild convex right scoliosis.   Electronically Signed   By: Allison Kannerhomas  Kane M.D.   On: 06/02/2017 09:19     Objective:  VS:  HT:    WT:   BMI:     BP:107/60  HR:60bpm  TEMP: ( )  RESP:  Physical Exam  Ortho Exam Imaging: Xr C-arm No Report  Result Date: 08/14/2018 Please see Notes tab for imaging impression.

## 2018-08-15 NOTE — Procedures (Signed)
S1 Lumbosacral Transforaminal Epidural Steroid Injection - Sub-Pedicular Approach with Fluoroscopic Guidance   Patient: Allison Kane      Date of Birth: 01/03/1986 MRN: [id number] PCP: Brunetta Jeans, PA-C      Visit Date: 08/14/2018   Universal Protocol:    Date/Time: 07/02/206:11 AM  Consent Given By: the patient  Position:  PRONE  Additional Comments: Vital signs were monitored before and after the procedure. Patient was prepped and draped in the usual sterile fashion. The correct patient, procedure, and site was verified.   Injection Procedure Details:  Procedure Site One Meds Administered:  Meds ordered this encounter  Medications  . betamethasone acetate-betamethasone sodium phosphate (CELESTONE) injection 12 mg    Laterality: Right  Location/Site:  S1 Foramen   Needle size: 22 ga.  Needle type: Spinal  Needle Placement: Transforaminal  Findings:   -Comments: Excellent flow of contrast along the nerve and into the epidural space.  Procedure Details: After squaring off the sacral end-plate to get a true AP view, the C-arm was positioned so that the best possible view of the S1 foramen was visualized. The soft tissues overlying this structure were infiltrated with 2-3 ml. of 1% Lidocaine without Epinephrine.    The spinal needle was inserted toward the target using a "trajectory" view along the fluoroscope beam.  Under AP and lateral visualization, the needle was advanced so it did not puncture dura. Biplanar projections were used to confirm position. Aspiration was confirmed to be negative for CSF and/or blood. A 1-2 ml. volume of Isovue-250 was injected and flow of contrast was noted at each level. Radiographs were obtained for documentation purposes.   After attaining the desired flow of contrast documented above, a 0.5 to 1.0 ml test dose of 0.25% Marcaine was injected into each respective transforaminal space.  The patient was observed for 90 seconds  post injection.  After no sensory deficits were reported, and normal lower extremity motor function was noted,   the above injectate was administered so that equal amounts of the injectate were placed at each foramen (level) into the transforaminal epidural space.   Additional Comments:  No complications occurred Dressing: Band-Aid with 2 x 2 sterile gauze    Post-procedure details: Patient was observed during the procedure. Post-procedure instructions were reviewed.  Patient left the clinic in stable condition.

## 2018-12-25 ENCOUNTER — Other Ambulatory Visit: Payer: Self-pay | Admitting: Physician Assistant

## 2019-01-10 ENCOUNTER — Other Ambulatory Visit: Payer: Self-pay | Admitting: Physician Assistant

## 2019-02-14 HISTORY — PX: COMBINED REDUCTION MAMMAPLASTY W/ ABDOMINOPLASTY: SUR306

## 2019-03-08 IMAGING — DX DG LUMBAR SPINE COMPLETE 4+V
5 series · 5 of 5 positions shown · non-contrast
Comparison: No recent.

CLINICAL DATA: Chronic low back pain.  No injury.

EXAM:
LUMBAR SPINE - COMPLETE 4+ VIEW

[lumbar spine ap]
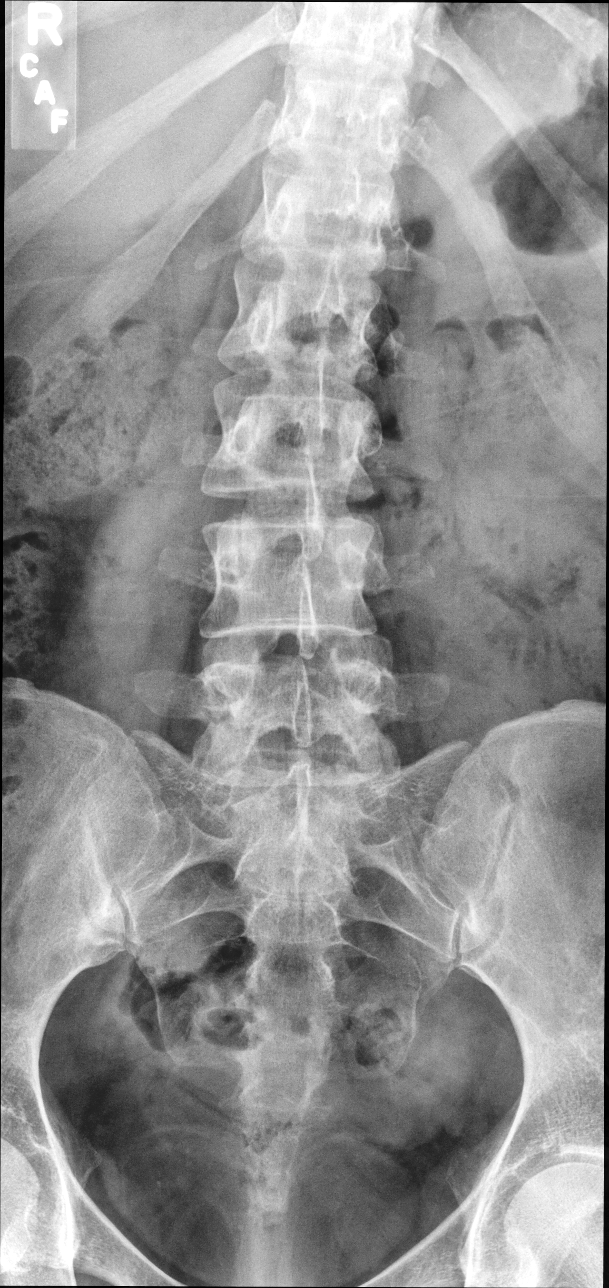

[lumbar spine oblique (1 of 2)]
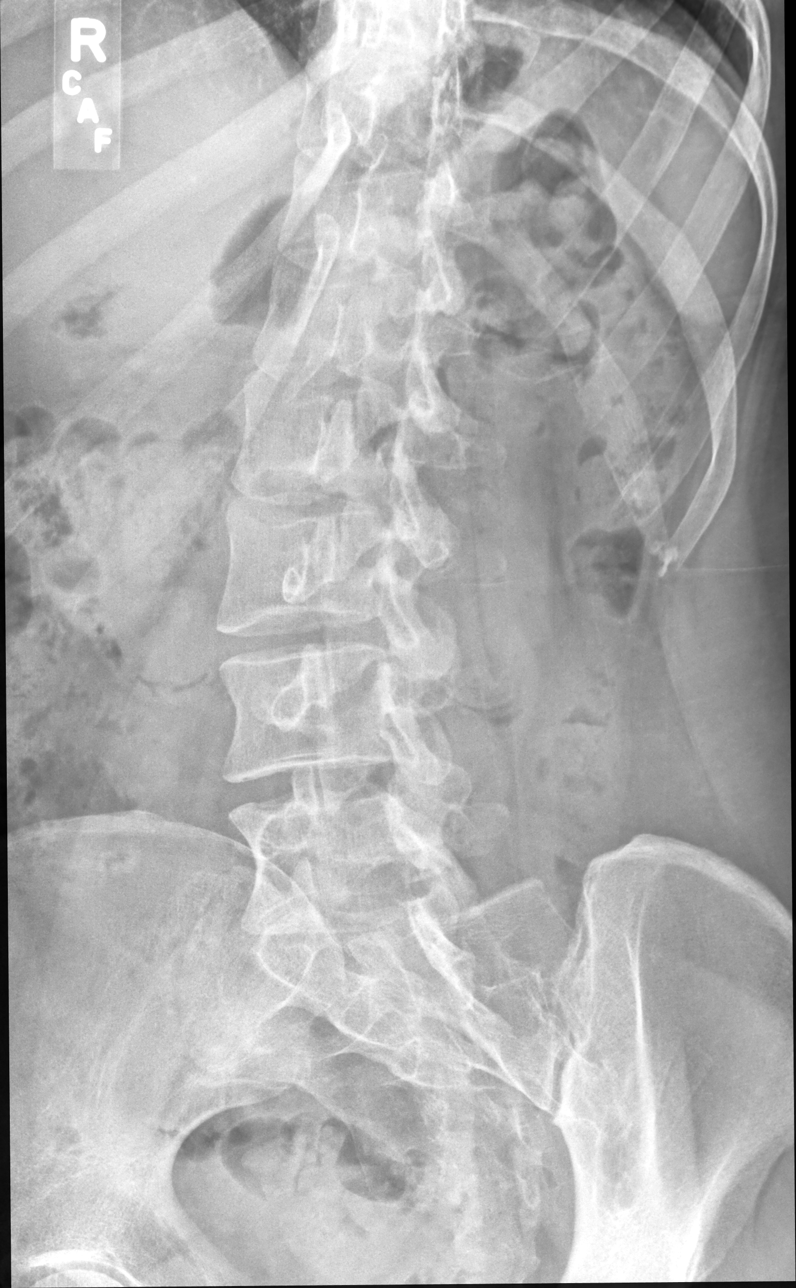

[lumbar spine oblique (2 of 2)]
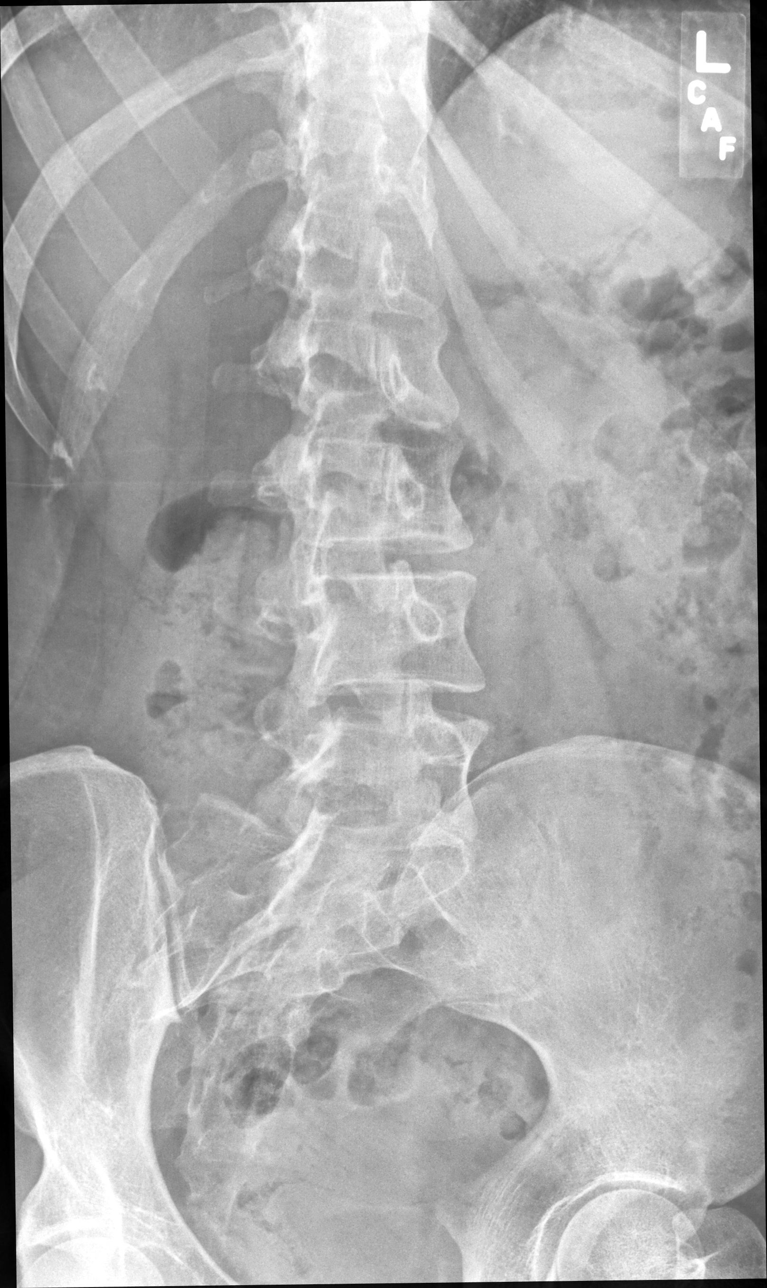

[lumbar spine lat (1 of 2)]
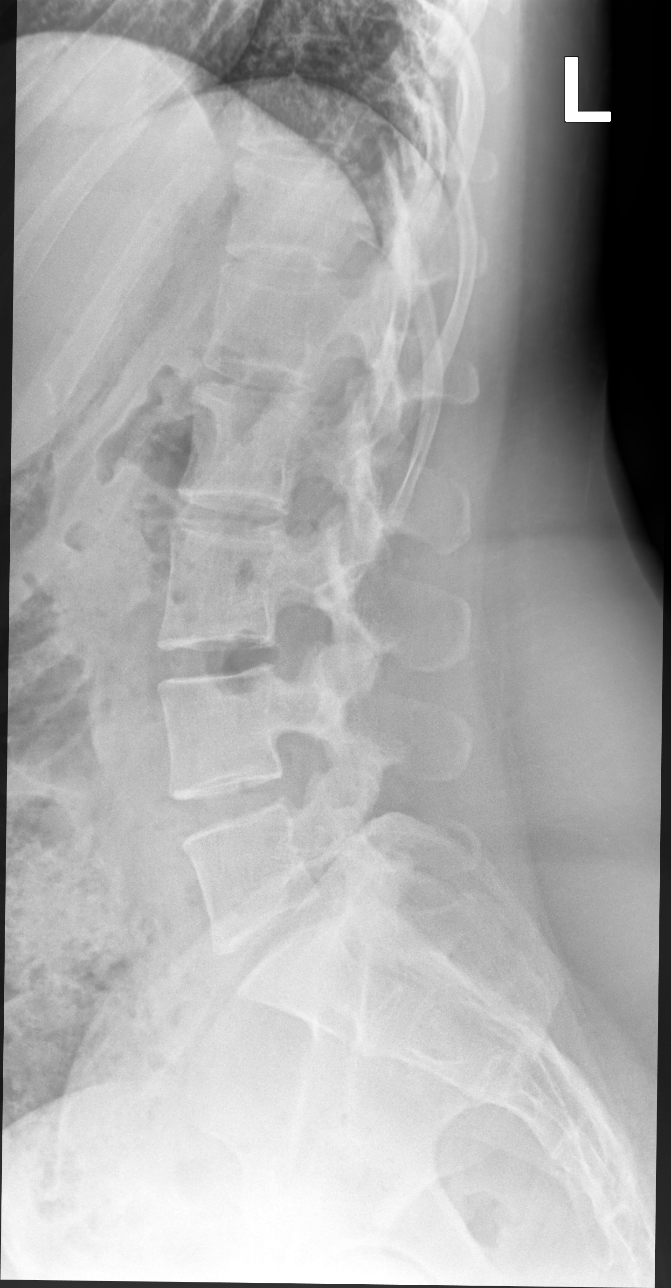

[lumbar spine lat (2 of 2)]
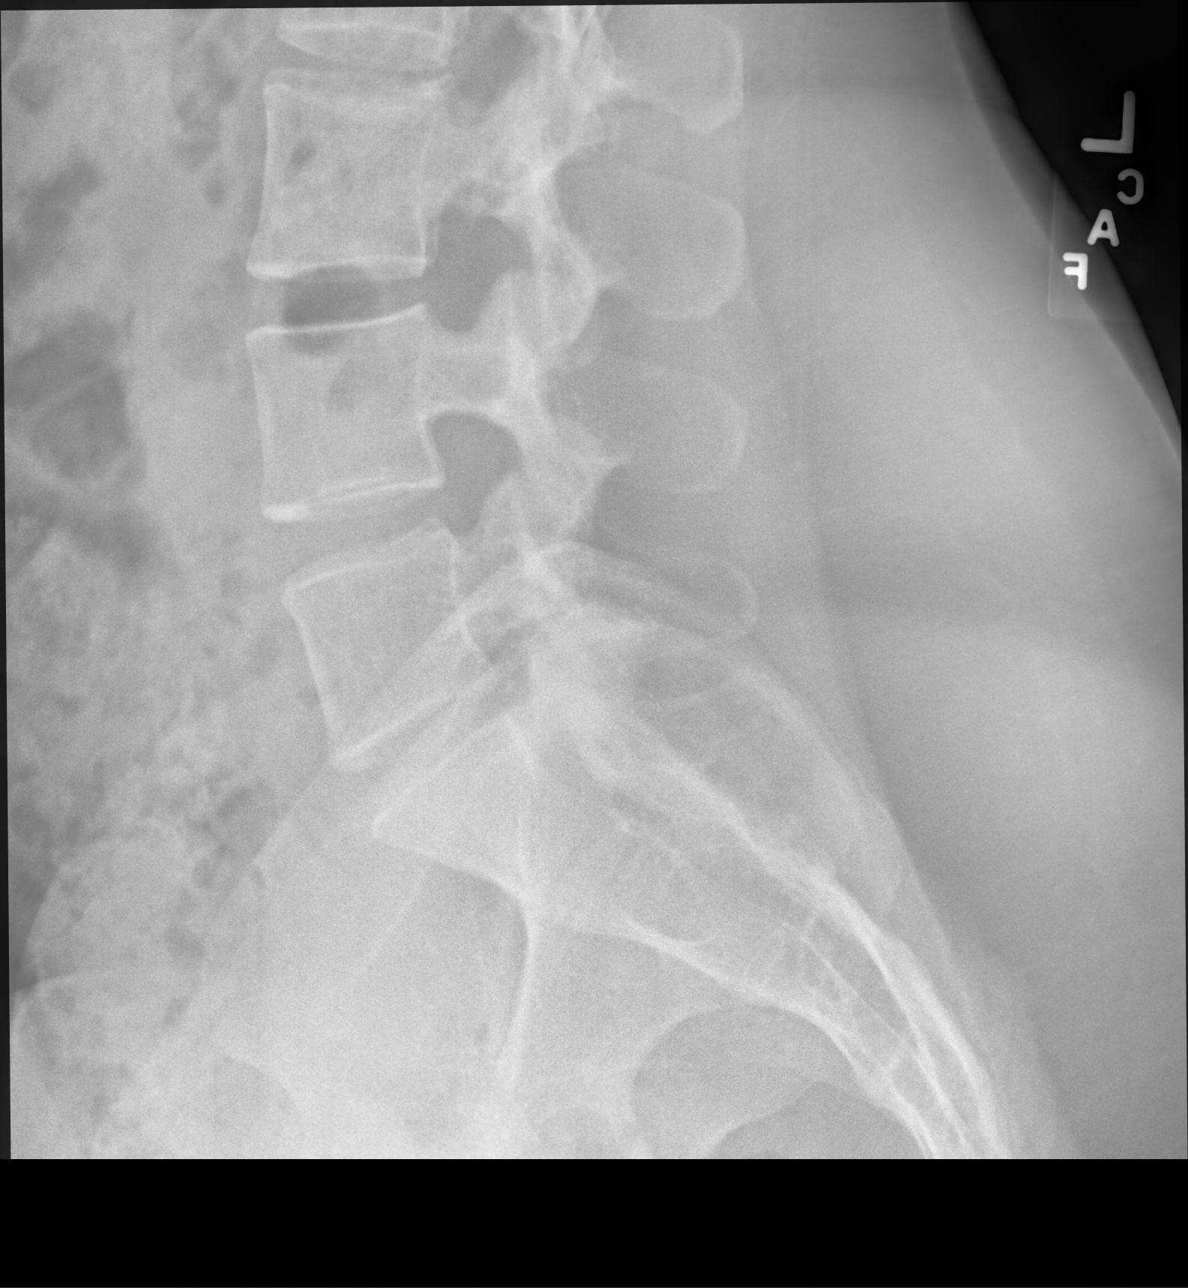

[5 of 5 positions shown; findings below may reference images not displayed]

FINDINGS: Mild scoliosis concave left. No acute bony abnormality. No evidence
of fracture. Soft tissues unremarkable.
IMPRESSION: Mild scoliosis concave left.  No acute bony abnormality.

## 2019-04-28 ENCOUNTER — Other Ambulatory Visit: Payer: Self-pay | Admitting: Emergency Medicine

## 2019-04-28 DIAGNOSIS — R062 Wheezing: Secondary | ICD-10-CM

## 2019-04-28 DIAGNOSIS — J4520 Mild intermittent asthma, uncomplicated: Secondary | ICD-10-CM

## 2019-04-28 MED ORDER — ALBUTEROL SULFATE HFA 108 (90 BASE) MCG/ACT IN AERS: INHALATION_SPRAY | RESPIRATORY_TRACT | 3 refills | Status: DC

## 2019-04-30 ENCOUNTER — Other Ambulatory Visit: Payer: Self-pay | Admitting: Family Medicine

## 2019-04-30 MED ORDER — BACLOFEN 10 MG PO TABS: 10.0000 mg | ORAL_TABLET | Freq: Three times a day (TID) | ORAL | 3 refills | Status: DC | PRN

## 2019-06-20 ENCOUNTER — Encounter: Payer: Self-pay | Admitting: Physician Assistant

## 2019-06-20 ENCOUNTER — Telehealth (INDEPENDENT_AMBULATORY_CARE_PROVIDER_SITE_OTHER): Payer: 59 | Admitting: Physician Assistant

## 2019-06-20 ENCOUNTER — Other Ambulatory Visit: Payer: Self-pay

## 2019-06-20 DIAGNOSIS — B9689 Other specified bacterial agents as the cause of diseases classified elsewhere: Secondary | ICD-10-CM | POA: Diagnosis not present

## 2019-06-20 DIAGNOSIS — J019 Acute sinusitis, unspecified: Secondary | ICD-10-CM | POA: Diagnosis not present

## 2019-06-20 MED ORDER — AZELASTINE HCL 0.1 % NA SOLN: 1.0000 | Freq: Two times a day (BID) | NASAL | 1 refills | Status: DC

## 2019-06-20 MED ORDER — DOXYCYCLINE HYCLATE 100 MG PO CAPS: 100.0000 mg | ORAL_CAPSULE | Freq: Two times a day (BID) | ORAL | 0 refills | Status: DC

## 2019-06-20 MED ORDER — BENZONATATE 100 MG PO CAPS: 100.0000 mg | ORAL_CAPSULE | Freq: Three times a day (TID) | ORAL | 0 refills | Status: DC | PRN

## 2019-06-20 NOTE — Progress Notes (Signed)
I have discussed the procedure for the virtual visit with the patient who has given consent to proceed with assessment and treatment. Patient is unable to obtain vital signs.  Makell Cyr N Elliemae Braman, LPN     

## 2019-06-20 NOTE — Patient Instructions (Signed)
Instructions sent to MyChart

## 2019-06-20 NOTE — Progress Notes (Signed)
Virtual Visit via Video   I connected with patient on 06/20/19 at  9:00 AM EDT by a video enabled telemedicine application and verified that I am speaking with the correct person using two identifiers.  Location patient: Home Location provider: [facility] Persons participating in the virtual visit: Patient, Provider, CMA (Patina Moore)  I discussed the limitations of evaluation and management by telemedicine and the availability of in person appointments. The patient expressed understanding and agreed to proceed.  Subjective:   HPI:   Patient presents via [facility] today complaining of 2.5 weeks of sinus symptoms.  Patient states that initially symptoms started as a flareup of her allergies with nasal congestion, rhinorrhea and postnasal drainage.  States she has been taking her allergy medicines as directed.  Since that time symptoms have just gradually worsened where she is now having significant nasal congestion with significant sinus pain, headache, facial pain and left ear pain.  Denies fever or chills.  Notes some mild chest congestion with cough but denies chest tightness, shortness of breath or chest pain.  Denies any recent travel or sick contact.  Works from home.  Has also been taking some OTC sinus medication with only minimal relief in her symptoms.  ROS:   See pertinent positives and negatives per HPI.  Patient Active Problem List   Diagnosis Date Noted  . Chronic low back pain 12/10/2017  . Ingrown toenail 06/26/2017  . History of abnormal cervical Papanicolaou smear 05/04/2017  . Nasal septal deviation 05/04/2015  . Asthma 03/23/2015  . GERD without esophagitis 03/23/2015  . IBS (irritable bowel syndrome) 03/23/2015  . S/P cesarean section 01/12/2014  . History of abnormal Pap smear 09/09/2012    Social History   Tobacco Use  . Smoking status: Former Smoker    Packs/day: 0.50    Years: 9.00    Pack years: 4.50    Types: Cigarettes    Quit  date: 10/13/2011    Years since quitting: 7.6  . Smokeless tobacco: Never Used  Substance Use Topics  . Alcohol use: Yes    Comment: 5 drinks/month    Current Outpatient Medications:  .  albuterol (VENTOLIN HFA) 108 (90 Base) MCG/ACT inhaler, INHALE 2 PUFFS INTO THE LUNGS EVERY 6 HOURS AS NEEDED FOR WHEEZING OR SHORTNESS OF BREATH, Disp: 18 g, Rfl: 3 .  Ascorbic Acid (VITAMIN C) 100 MG tablet, Take 250 mg by mouth daily. , Disp: , Rfl:  .  Aspirin-Acetaminophen-Caffeine (EXCEDRIN PO), Take by mouth as needed., Disp: , Rfl:  .  baclofen (LIORESAL) 10 MG tablet, Take 1 tablet (10 mg total) by mouth 3 (three) times daily as needed for muscle spasms., Disp: 30 each, Rfl: 3 .  cetirizine (ZYRTEC) 10 MG tablet, Take 10 mg by mouth daily., Disp: , Rfl:  .  Cholecalciferol (VITAMIN D3) 5000 units CAPS, Take 1 capsule by mouth daily., Disp: , Rfl:  .  gabapentin (NEURONTIN) 300 MG capsule, TAKE 1 CAPSULE(300 MG) BY MOUTH TWICE DAILY, Disp: 60 capsule, Rfl: 3 .  azelastine (ASTELIN) 0.1 % nasal spray, Place 2 sprays into both nostrils 2 (two) times daily. Use in each nostril as directed (Patient not taking: Reported on 06/20/2019), Disp: 30 mL, Rfl: 3 .  BIOTIN PO, Take 1 tablet by mouth daily. , Disp: , Rfl:  .  Calcium-Vitamins C & D (CALCIUM/C/D PO), Take 1,000 mg by mouth daily. , Disp: , Rfl:  .  fluticasone (FLONASE) 50 MCG/ACT nasal spray, Place into both nostrils  daily., Disp: , Rfl:  .  Norethindrone-Ethinyl Estradiol-Fe Biphas (LO LOESTRIN FE) 1 MG-10 MCG / 10 MCG tablet, Take 1 tablet by mouth daily., Disp: , Rfl:  .  tiZANidine (ZANAFLEX) 2 MG tablet, Take 1-2 tablets (2-4 mg total) by mouth every 6 (six) hours as needed for muscle spasms. (Patient not taking: Reported on 06/20/2019), Disp: 60 tablet, Rfl: 1 .  traMADol (ULTRAM) 50 MG tablet, Take 1 tablet (50 mg total) by mouth every 6 (six) hours as needed. (Patient not taking: Reported on 06/20/2019), Disp: 20 tablet, Rfl: 0  Allergies    Allergen Reactions  . Peanut-Containing Drug Products     Allergic to all nuts  . Wasp Venom Anaphylaxis and Swelling  . Latex Swelling  . Penicillins Other (See Comments)    Reaction:  High fever    Objective:   There were no vitals taken for this visit.  Patient is well-developed, well-nourished in no acute distress.  Resting comfortably at home.  Head is normocephalic, atraumatic.  No labored breathing.  Speech is clear and coherent with logical content.  Patient is alert and oriented at baseline.    Assessment and Plan:   1. Acute bacterial sinusitis Rx doxycycline.  Increase fluids.  Rest.  Saline nasal spray.  Probiotic.  Mucinex as directed.  Humidifier in bedroom.  Continue allergy medication regimen.  Tessalon per orders.  Restart Astelin nasal spray.  Call or return to clinic if symptoms are not improving.     Leeanne Rio, PA-C 06/20/2019

## 2019-06-23 ENCOUNTER — Other Ambulatory Visit: Payer: Self-pay | Admitting: Emergency Medicine

## 2019-06-23 DIAGNOSIS — J4520 Mild intermittent asthma, uncomplicated: Secondary | ICD-10-CM

## 2019-06-23 MED ORDER — ALBUTEROL SULFATE HFA 108 (90 BASE) MCG/ACT IN AERS: INHALATION_SPRAY | RESPIRATORY_TRACT | 3 refills | Status: DC

## 2019-10-07 ENCOUNTER — Other Ambulatory Visit: Payer: Self-pay | Admitting: Physician Assistant

## 2019-10-07 DIAGNOSIS — J4520 Mild intermittent asthma, uncomplicated: Secondary | ICD-10-CM

## 2019-10-27 ENCOUNTER — Other Ambulatory Visit: Payer: Self-pay

## 2019-10-27 ENCOUNTER — Encounter: Payer: Self-pay | Admitting: Physician Assistant

## 2019-10-27 ENCOUNTER — Ambulatory Visit (INDEPENDENT_AMBULATORY_CARE_PROVIDER_SITE_OTHER): Payer: 59 | Admitting: Physician Assistant

## 2019-10-27 VITALS — BP 100/70 | HR 80 | Temp 98.2°F | Resp 14 | Ht 63.0 in | Wt 133.0 lb

## 2019-10-27 DIAGNOSIS — B9689 Other specified bacterial agents as the cause of diseases classified elsewhere: Secondary | ICD-10-CM | POA: Diagnosis not present

## 2019-10-27 DIAGNOSIS — J019 Acute sinusitis, unspecified: Secondary | ICD-10-CM | POA: Diagnosis not present

## 2019-10-27 DIAGNOSIS — R300 Dysuria: Secondary | ICD-10-CM

## 2019-10-27 LAB — POCT URINALYSIS DIPSTICK
Bilirubin, UA: NEGATIVE
Glucose, UA: NEGATIVE
Ketones, UA: NEGATIVE
Nitrite, UA: NEGATIVE
Protein, UA: POSITIVE — AB
Spec Grav, UA: 1.015 (ref 1.010–1.025)
Urobilinogen, UA: 0.2 E.U./dL
pH, UA: 7 (ref 5.0–8.0)

## 2019-10-27 MED ORDER — SULFAMETHOXAZOLE-TRIMETHOPRIM 800-160 MG PO TABS
1.0000 | ORAL_TABLET | Freq: Two times a day (BID) | ORAL | 0 refills | Status: DC
Start: 2019-10-27 — End: 2022-07-27

## 2019-10-27 NOTE — Patient Instructions (Signed)
See below regarding urinary symptoms. The antibiotic given (Bactrim) will cover the sinuses as well. Increase fluids. Continue allergy medications.   Your symptoms are consistent with a bladder infection, also called acute cystitis. Please take your antibiotic (Bactrim) as directed until all pills are gone.  Stay very well hydrated.  Consider a daily probiotic (Align, Culturelle, or Activia) to help prevent stomach upset caused by the antibiotic.  Taking a probiotic daily may also help prevent recurrent UTIs.  Also consider taking AZO (Phenazopyridine) tablets to help decrease pain with urination.  I will call you with your urine testing results.  We will change antibiotics if indicated.  Call or return to clinic if symptoms are not resolved by completion of antibiotic.   Urinary Tract Infection A urinary tract infection (UTI) can occur any place along the urinary tract. The tract includes the kidneys, ureters, bladder, and urethra. A type of germ called bacteria often causes a UTI. UTIs are often helped with antibiotic medicine.  HOME CARE   If given, take antibiotics as told by your doctor. Finish them even if you start to feel better.  Drink enough fluids to keep your pee (urine) clear or pale yellow.  Avoid tea, drinks with caffeine, and bubbly (carbonated) drinks.  Pee often. Avoid holding your pee in for a long time.  Pee before and after having sex (intercourse).  Wipe from front to back after you poop (bowel movement) if you are a woman. Use each tissue only once. GET HELP RIGHT AWAY IF:   You have back pain.  You have lower belly (abdominal) pain.  You have chills.  You feel sick to your stomach (nauseous).  You throw up (vomit).  Your burning or discomfort with peeing does not go away.  You have a fever.  Your symptoms are not better in 3 days. MAKE SURE YOU:   Understand these instructions.  Will watch your condition.  Will get help right away if you are not doing  well or get worse. Document Released: 07/19/2007 Document Revised: 10/25/2011 Document Reviewed: 08/31/2011 [facility] Patient Information 2015 [place], [place]. This information is not intended to replace advice given to you by your health care provider. Make sure you discuss any questions you have with your health care provider.

## 2019-10-27 NOTE — Progress Notes (Signed)
Patient presents to clinic today c/o 4 days of dysuria, urinary urgency and frequency with suprapubic pressure and some mild low back pain.  Denies flank pain, nausea or vomiting.  Denies hematuria.  Denies fever, chills, malaise or fatigue.  Has noted 1-1/2 weeks of maxillary sinus pressure with some sinus pain and left ear pain.  Denies any chest congestion or cough.  Denies loss of taste or smell.  Denies recent travel or sick contact.  Has substantial history of sinus infections and notes this feels identical to previous episodes.   10/18/2019 -- LMP    Past Medical History:  Diagnosis Date  . Allergy   . Asthma   . Frequent headaches   . GERD (gastroesophageal reflux disease)   . IBS (irritable bowel syndrome)   . Migraines     Current Outpatient Medications on File Prior to Visit  Medication Sig Dispense Refill  . albuterol (VENTOLIN HFA) 108 (90 Base) MCG/ACT inhaler INHALE 2 PUFFS INTO THE LUNGS EVERY 6 HOURS AS NEEDED FOR WHEEZING OR SHORTNESS OF BREATH 8.5 g 3  . Ascorbic Acid (VITAMIN C) 100 MG tablet Take 250 mg by mouth daily.     . Aspirin-Acetaminophen-Caffeine (EXCEDRIN PO) Take by mouth as needed.    Marland Kitchen azelastine (ASTELIN) 0.1 % nasal spray Place 1 spray into both nostrils 2 (two) times daily. Use in each nostril as directed 30 mL 1  . BIOTIN PO Take 1 tablet by mouth daily.     . Calcium-Vitamins C & D (CALCIUM/C/D PO) Take 1,000 mg by mouth daily.     . cetirizine (ZYRTEC) 10 MG tablet Take 10 mg by mouth daily.    . Cholecalciferol (VITAMIN D3) 5000 units CAPS Take 1 capsule by mouth daily.    . fluticasone (FLONASE) 50 MCG/ACT nasal spray Place into both nostrils daily.    Marland Kitchen levonorgestrel-ethinyl estradiol (VIENVA) 0.1-20 MG-MCG tablet Vienva 0.1 mg-20 mcg tablet  TAKE 1 TABLET BY MOUTH EVERY DAY    . gabapentin (NEURONTIN) 300 MG capsule TAKE 1 CAPSULE(300 MG) BY MOUTH TWICE DAILY (Patient not taking: Reported on 10/27/2019) 60 capsule 3   No current  facility-administered medications on file prior to visit.    Allergies  Allergen Reactions  . Peanut-Containing Drug Products     Allergic to all nuts  . Wasp Venom Anaphylaxis and Swelling  . Latex Swelling  . Penicillins Other (See Comments)    Reaction:  High fever    Family History  Problem Relation Age of Onset  . Cancer Maternal Aunt   . Osteoporosis Maternal Grandmother   . Arthritis Maternal Grandmother   . Birth defects Maternal Grandmother   . Cancer Maternal Grandmother   . Hyperlipidemia Maternal Grandmother   . Hyperlipidemia Mother   . Diabetes Father   . Hyperlipidemia Father   . Hyperlipidemia Brother   . Arthritis Paternal Grandmother   . Asthma Paternal Grandmother   . Cancer Paternal Grandmother        Colon, Metastatic  . Hearing loss Paternal Grandmother   . Hyperlipidemia Paternal Grandmother   . Hypertension Paternal Grandmother     Social History   Socioeconomic History  . Marital status: Married    Spouse name: Not on file  . Number of children: Not on file  . Years of education: Not on file  . Highest education level: Not on file  Occupational History  . Not on file  Tobacco Use  . Smoking status: Former Smoker  Packs/day: 0.50    Years: 9.00    Pack years: 4.50    Types: Cigarettes    Quit date: 10/13/2011    Years since quitting: 8.0  . Smokeless tobacco: Never Used  Vaping Use  . Vaping Use: Never used  Substance and Sexual Activity  . Alcohol use: Yes    Comment: 5 drinks/month  . Drug use: No  . Sexual activity: Yes    Partners: Male    Birth control/protection: None  Other Topics Concern  . Not on file  Social History Narrative  . Not on file   Social Determinants of Health   Financial Resource Strain:   . Difficulty of Paying Living Expenses: Not on file  Food Insecurity:   . Worried About Programme researcher, broadcasting/film/video in the Last Year: Not on file  . Ran Out of Food in the Last Year: Not on file  Transportation Needs:    . Lack of Transportation (Medical): Not on file  . Lack of Transportation (Non-Medical): Not on file  Physical Activity:   . Days of Exercise per Week: Not on file  . Minutes of Exercise per Session: Not on file  Stress:   . Feeling of Stress : Not on file  Social Connections:   . Frequency of Communication with Friends and Family: Not on file  . Frequency of Social Gatherings with Friends and Family: Not on file  . Attends Religious Services: Not on file  . Active Member of Clubs or Organizations: Not on file  . Attends Banker Meetings: Not on file  . Marital Status: Not on file   Review of Systems - See HPI.  All other ROS are negative.  BP 100/70   Pulse 80   Temp 98.2 F (36.8 C) (Temporal)   Resp 14   Ht 5\' 3"  (1.6 m)   Wt 133 lb (60.3 kg)   SpO2 99%   BMI 23.56 kg/m   Physical Exam Vitals reviewed.  Constitutional:      Appearance: Normal appearance.  HENT:     Head: Normocephalic and atraumatic.     Right Ear: Tympanic membrane normal. There is no impacted cerumen.     Left Ear: Tympanic membrane normal. There is no impacted cerumen.  Eyes:     Pupils: Pupils are equal, round, and reactive to light.  Cardiovascular:     Rate and Rhythm: Normal rate and regular rhythm.     Pulses: Normal pulses.     Heart sounds: Normal heart sounds.  Pulmonary:     Effort: Pulmonary effort is normal.     Breath sounds: Normal breath sounds.  Abdominal:     General: Bowel sounds are normal.     Palpations: Abdomen is soft.     Tenderness: There is no right CVA tenderness or left CVA tenderness.  Musculoskeletal:     Cervical back: Neck supple.  Neurological:     General: No focal deficit present.     Mental Status: She is alert and oriented to person, place, and time.     Assessment/Plan: 1. Dysuria Classic UTI symptoms.  UA positive for protein and leukocytes.  Will send for culture.  Empirically treat for UTI with Bactrim.  Supportive measures and OTC  medications reviewed.  Will alter regimen based on culture results. - POCT Urinalysis Dipstick - Urine Culture; Future - Urine Culture  2. Acute bacterial sinusitis Rx Bactrim.  Increase fluids.  Rest.  Saline nasal spray.  Probiotic.  Mucinex as directed.  Humidifier in bedroom.  Continue allergy regimen.  Call or return to clinic if symptoms are not improving.  Discussed with her even though this does not seem to be consistent with Covid, strict testing and quarantine precautions discussed with patient.   This visit occurred during the SARS-CoV-2 public health emergency.  Safety protocols were in place, including screening questions prior to the visit, additional usage of staff PPE, and extensive cleaning of exam room while observing appropriate contact time as indicated for disinfecting solutions.     Piedad Climes, PA-C

## 2019-10-30 LAB — URINE CULTURE
MICRO NUMBER:: 10942479
SPECIMEN QUALITY:: ADEQUATE

## 2020-01-14 ENCOUNTER — Other Ambulatory Visit: Payer: Self-pay | Admitting: Physician Assistant

## 2020-01-14 DIAGNOSIS — J4520 Mild intermittent asthma, uncomplicated: Secondary | ICD-10-CM

## 2020-02-25 ENCOUNTER — Telehealth: Payer: Self-pay | Admitting: Physical Medicine and Rehabilitation

## 2020-02-25 NOTE — Telephone Encounter (Signed)
Right S1 TF on 08/14/2018. Ok to repeat if criteria met vs ov since more than a year ago?

## 2020-02-25 NOTE — Telephone Encounter (Signed)
Pt would like to get scheduled for a repeat cortisone injection  [phone]

## 2020-02-26 ENCOUNTER — Telehealth: Payer: Self-pay

## 2020-02-26 NOTE — Telephone Encounter (Signed)
Error

## 2020-03-10 ENCOUNTER — Encounter: Payer: Self-pay | Admitting: Physical Medicine and Rehabilitation

## 2020-03-10 ENCOUNTER — Other Ambulatory Visit: Payer: Self-pay

## 2020-03-10 ENCOUNTER — Ambulatory Visit (INDEPENDENT_AMBULATORY_CARE_PROVIDER_SITE_OTHER): Payer: 59 | Admitting: Physical Medicine and Rehabilitation

## 2020-03-10 VITALS — BP 114/72 | HR 74

## 2020-03-10 DIAGNOSIS — M5116 Intervertebral disc disorders with radiculopathy, lumbar region: Secondary | ICD-10-CM | POA: Diagnosis not present

## 2020-03-10 DIAGNOSIS — M5126 Other intervertebral disc displacement, lumbar region: Secondary | ICD-10-CM

## 2020-03-10 DIAGNOSIS — M5416 Radiculopathy, lumbar region: Secondary | ICD-10-CM | POA: Diagnosis not present

## 2020-03-10 NOTE — Progress Notes (Signed)
Pt state lower and middle back pain. Pt state the she feels numbness both legs when she driving or just sitting. Pt state standing, sitting and lifting makes the pain worse. Pt state massage oil, excise and over the counter meds to help ease the pain.  Numeric Pain Rating Scale and Functional Assessment Average Pain 8 Pain Right Now 2 My pain is constant, tingling and aching Pain is worse with: walking, sitting, standing and some activites Pain improves with: rest, therapy/exercise and medication   In the last MONTH (on 0-10 scale) has pain interfered with the following?  1. General activity like being  able to carry out your everyday physical activities such as walking, climbing stairs, carrying groceries, or moving a chair?  Rating(8)  2. Relation with others like being able to carry out your usual social activities and roles such as  activities at home, at work and in your community. Rating(7)  3. Enjoyment of life such that you have  been bothered by emotional problems such as feeling anxious, depressed or irritable?  Rating(8)
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
## 2020-03-11 ENCOUNTER — Encounter: Payer: Self-pay | Admitting: Physical Medicine and Rehabilitation

## 2020-03-11 ENCOUNTER — Ambulatory Visit: Payer: Self-pay

## 2020-03-11 ENCOUNTER — Ambulatory Visit (INDEPENDENT_AMBULATORY_CARE_PROVIDER_SITE_OTHER): Payer: 59 | Admitting: Physical Medicine and Rehabilitation

## 2020-03-11 VITALS — BP 114/68 | HR 84

## 2020-03-11 DIAGNOSIS — M5416 Radiculopathy, lumbar region: Secondary | ICD-10-CM | POA: Diagnosis not present

## 2020-03-11 MED ORDER — BETAMETHASONE SOD PHOS & ACET 6 (3-3) MG/ML IJ SUSP
12.0000 mg | Freq: Once | INTRAMUSCULAR | Status: AC
  Administered 2020-03-11: 12 mg

## 2020-03-11 NOTE — Progress Notes (Signed)
Pt state middle back pain. Pt state standing and sitting makes the pain worse. Pt state she takes pain meds to help ease the pain.  Numeric Pain Rating Scale and Functional Assessment Average Pain 2   In the last MONTH (on 0-10 scale) has pain interfered with the following?  1. General activity like being  able to carry out your everyday physical activities such as walking, climbing stairs, carrying groceries, or moving a chair?  Rating(8)   +Driver, -BT, -Dye Allergies.

## 2020-03-11 NOTE — Procedures (Signed)
Lumbar Epidural Steroid Injection - Interlaminar Approach with Fluoroscopic Guidance  Patient: Allison Kane      Date of Birth: Mar 10, 1985 MRN: [id number] PCP: Waldon Merl, PA-C      Visit Date: 03/11/2020   Universal Protocol:     Consent Given By: the patient  Position: PRONE  Additional Comments: Vital signs were monitored before and after the procedure. Patient was prepped and draped in the usual sterile fashion. The correct patient, procedure, and site was verified.   Injection Procedure Details:   Procedure diagnoses: Lumbar radiculopathy [M54.16]   Meds Administered:  Meds ordered this encounter  Medications  . betamethasone acetate-betamethasone sodium phosphate (CELESTONE) injection 12 mg     Laterality: Right  Location/Site:  L5-S1  Needle: 3.5 in., 20 ga. Tuohy  Needle Placement: Paramedian epidural  Findings:   -Comments: Excellent flow of contrast into the epidural space.  Procedure Details: Using a paramedian approach from the side mentioned above, the region overlying the inferior lamina was localized under fluoroscopic visualization and the soft tissues overlying this structure were infiltrated with 4 ml. of 1% Lidocaine without Epinephrine. The Tuohy needle was inserted into the epidural space using a paramedian approach.   The epidural space was localized using loss of resistance along with counter oblique bi-planar fluoroscopic views.  After negative aspirate for air, blood, and CSF, a 2 ml. volume of Isovue-250 was injected into the epidural space and the flow of contrast was observed. Radiographs were obtained for documentation purposes.    The injectate was administered into the level noted above.   Additional Comments:  The patient tolerated the procedure well Dressing: 2 x 2 sterile gauze and Band-Aid    Post-procedure details: Patient was observed during the procedure. Post-procedure instructions were reviewed.  Patient left  the clinic in stable condition.

## 2020-03-11 NOTE — Patient Instructions (Signed)

## 2020-03-11 NOTE — Progress Notes (Signed)
Allison Kane - 35 y.o. female MRN [id number]  Date of birth: [birth year]  Office Visit Note: Visit Date: 03/11/2020 PCP: Waldon Merl, PA-C Referred by: Waldon Merl, PA-C  Subjective: Chief Complaint  Patient presents with  . Middle Back - Pain   HPI:  Allison Kane is a 35 y.o. female who comes in today for planned Right L5-S1 Lumbar epidural steroid injection with fluoroscopic guidance.  The patient has failed conservative care including home exercise, medications, time and activity modification.  This injection will be diagnostic and hopefully therapeutic.  Please see requesting physician notes for further details and justification.  ROS Otherwise per HPI.  Assessment & Plan: Visit Diagnoses:    ICD-10-CM   1. Lumbar radiculopathy  M54.16 XR C-ARM NO REPORT    Epidural Steroid injection    betamethasone acetate-betamethasone sodium phosphate (CELESTONE) injection 12 mg    Plan: No additional findings.   Meds & Orders:  Meds ordered this encounter  Medications  . betamethasone acetate-betamethasone sodium phosphate (CELESTONE) injection 12 mg    Orders Placed This Encounter  Procedures  . XR C-ARM NO REPORT  . Epidural Steroid injection    Follow-up: Return if symptoms worsen or fail to improve.   Procedures: No procedures performed  Lumbar Epidural Steroid Injection - Interlaminar Approach with Fluoroscopic Guidance  Patient: Allison Kane      Date of Birth: 04/29/1985 MRN: [id number] PCP: Waldon Merl, PA-C      Visit Date: 03/11/2020   Universal Protocol:     Consent Given By: the patient  Position: PRONE  Additional Comments: Vital signs were monitored before and after the procedure. Patient was prepped and draped in the usual sterile fashion. The correct patient, procedure, and site was verified.   Injection Procedure Details:   Procedure diagnoses: Lumbar radiculopathy [M54.16]   Meds Administered:  Meds ordered  this encounter  Medications  . betamethasone acetate-betamethasone sodium phosphate (CELESTONE) injection 12 mg     Laterality: Right  Location/Site:  L5-S1  Needle: 3.5 in., 20 ga. Tuohy  Needle Placement: Paramedian epidural  Findings:   -Comments: Excellent flow of contrast into the epidural space.  Procedure Details: Using a paramedian approach from the side mentioned above, the region overlying the inferior lamina was localized under fluoroscopic visualization and the soft tissues overlying this structure were infiltrated with 4 ml. of 1% Lidocaine without Epinephrine. The Tuohy needle was inserted into the epidural space using a paramedian approach.   The epidural space was localized using loss of resistance along with counter oblique bi-planar fluoroscopic views.  After negative aspirate for air, blood, and CSF, a 2 ml. volume of Isovue-250 was injected into the epidural space and the flow of contrast was observed. Radiographs were obtained for documentation purposes.    The injectate was administered into the level noted above.   Additional Comments:  The patient tolerated the procedure well Dressing: 2 x 2 sterile gauze and Band-Aid    Post-procedure details: Patient was observed during the procedure. Post-procedure instructions were reviewed.  Patient left the clinic in stable condition.    Clinical History: MRI LUMBAR SPINE WITHOUT CONTRAST  TECHNIQUE: Multiplanar, multisequence MR imaging of the lumbar spine was performed. No intravenous contrast was administered.  COMPARISON:  Plain films lumbar spine 05/04/2017.  FINDINGS: Segmentation:  Standard.  Alignment:  Mild convex right scoliosis noted.  No listhesis.  Vertebrae: Height and signal are normal. No pars interarticularis defect.  Conus medullaris and cauda  equina: Conus extends to the L1-2 level. Conus and cauda equina appear normal.  Paraspinal and other soft tissues:  Negative.  Disc levels:  The T11-12 to L4-5 levels are negative.  L5-S1: There is a broad-based central and right side disc protrusion. The disc encroaches on the right S1 root and slightly indents the ventral thecal sac. The foramina are open.  IMPRESSION: Broad-based central and eccentric to the right protrusion at L5-S1 encroaches on the right S1 in the subarticular recesses slightly indents the ventral thecal sac.  Mild convex right scoliosis.   Electronically Signed   By: Drusilla Kanner M.D.   On: 06/02/2017 09:19     Objective:  VS:  HT:    WT:   BMI:     BP:114/68  HR:84bpm  TEMP: ( )  RESP:  Physical Exam Vitals and nursing note reviewed.  Constitutional:      General: She is not in acute distress.    Appearance: Normal appearance. She is not ill-appearing.  HENT:     Head: Normocephalic and atraumatic.     Right Ear: External ear normal.     Left Ear: External ear normal.  Eyes:     Extraocular Movements: Extraocular movements intact.  Cardiovascular:     Rate and Rhythm: Normal rate.     Pulses: Normal pulses.  Pulmonary:     Effort: Pulmonary effort is normal. No respiratory distress.  Abdominal:     General: There is no distension.     Palpations: Abdomen is soft.  Musculoskeletal:        General: Tenderness present.     Cervical back: Neck supple.     Right lower leg: No edema.     Left lower leg: No edema.     Comments: Patient has good distal strength with no pain over the greater trochanters.  No clonus or focal weakness.  Skin:    Findings: No erythema, lesion or rash.  Neurological:     General: No focal deficit present.     Mental Status: She is alert and oriented to person, place, and time.     Sensory: No sensory deficit.     Motor: No weakness or abnormal muscle tone.     Coordination: Coordination normal.  Psychiatric:        Mood and Affect: Mood normal.        Behavior: Behavior normal.      Imaging: No results  found.

## 2020-03-30 ENCOUNTER — Telehealth (INDEPENDENT_AMBULATORY_CARE_PROVIDER_SITE_OTHER): Payer: 59 | Admitting: Family Medicine

## 2020-03-30 DIAGNOSIS — R21 Rash and other nonspecific skin eruption: Secondary | ICD-10-CM | POA: Diagnosis not present

## 2020-03-30 NOTE — Patient Instructions (Addendum)
Can try allegra daily and topical hydrocortisone cream.   I included a link for some information about PR below - this is the rash I told you about that I think this could be:  [email or web address]   I hope you are feeling better soon!  Seek in person care promptly if your symptoms worsen, new concerns arise or you are not improving with treatment.  It was nice to meet you today. I help [facility] out with telemedicine visits on Tuesdays and Thursdays and am available for visits on those days. If you have any concerns or questions following this visit please schedule a follow up visit with your Primary Care doctor or seek care at a local urgent care clinic to avoid delays in care.

## 2020-03-30 NOTE — Progress Notes (Signed)
Virtual Visit via Video Note  I connected with Aolanis  on 03/30/20 at 11:00 AM EST by a video enabled telemedicine application and verified that I am speaking with the correct person using two identifiers.  Location patient: home, [place] Location provider:work or home office Persons participating in the virtual visit: patient, provider  I discussed the limitations of evaluation and management by telemedicine and the availability of in person appointments. The patient expressed understanding and agreed to proceed.   HPI:  Acute telemedicine visit for rash: -Onset: 1 week ago -Symptoms include: started with small patch on L flank, then spread to back, stomach, shoulder, upper arms -mildly itchy at times but not uncomfortable -Denies: new exposures, outdoor exposures, new soap/detergents, SOB, swelling tongue/face, fevers, malaise, resp symptoms, GI symptoms -she did have a head cold about 3 weeks ago, but it had resolved -Has tried:nothing -Pertinent past medical history:psoriasis - sees dermatologist, but reports this is very different from her psoriasis   ROS: See pertinent positives and negatives per HPI.  Past Medical History:  Diagnosis Date  . Allergy   . Asthma   . Frequent headaches   . GERD (gastroesophageal reflux disease)   . IBS (irritable bowel syndrome)   . Migraines     Past Surgical History:  Procedure Laterality Date  . CERVICAL BIOPSY  W/ LOOP ELECTRODE EXCISION  2011   [facility]  . CESAREAN SECTION     2013  . CESAREAN SECTION N/A 01/12/2014   Procedure: CESAREAN SECTION;  Surgeon: Sherian Rein, MD;  Location: [facility];  Service: Obstetrics;  Laterality: N/A;     Current Outpatient Medications:  .  albuterol (VENTOLIN HFA) 108 (90 Base) MCG/ACT inhaler, INHALE 2 PUFFS INTO THE LUNGS EVERY 6 HOURS AS NEEDED FOR WHEEZING OR SHORTNESS OF BREATH, Disp: 18 g, Rfl: 3 .  Ascorbic Acid (VITAMIN C) 100 MG tablet, Take 250 mg by mouth daily. , Disp: , Rfl:  .   Aspirin-Acetaminophen-Caffeine (EXCEDRIN PO), Take by mouth as needed., Disp: , Rfl:  .  azelastine (ASTELIN) 0.1 % nasal spray, Place 1 spray into both nostrils 2 (two) times daily. Use in each nostril as directed, Disp: 30 mL, Rfl: 1 .  baclofen (LIORESAL) 10 MG tablet, Take 10 mg by mouth 3 (three) times daily., Disp: , Rfl:  .  BIOTIN PO, Take 1 tablet by mouth daily. , Disp: , Rfl:  .  Calcium-Vitamins C & D (CALCIUM/C/D PO), Take 1,000 mg by mouth daily. , Disp: , Rfl:  .  cetirizine (ZYRTEC) 10 MG tablet, Take 10 mg by mouth daily., Disp: , Rfl:  .  Cholecalciferol (VITAMIN D3) 5000 units CAPS, Take 1 capsule by mouth daily., Disp: , Rfl:  .  fluticasone (FLONASE) 50 MCG/ACT nasal spray, Place into both nostrils daily., Disp: , Rfl:  .  gabapentin (NEURONTIN) 300 MG capsule, TAKE 1 CAPSULE(300 MG) BY MOUTH TWICE DAILY, Disp: 60 capsule, Rfl: 3 .  levonorgestrel-ethinyl estradiol (ALESSE) 0.1-20 MG-MCG tablet, Vienva 0.1 mg-20 mcg tablet  TAKE 1 TABLET BY MOUTH EVERY DAY, Disp: , Rfl:  .  sulfamethoxazole-trimethoprim (BACTRIM DS) 800-160 MG tablet, Take 1 tablet by mouth 2 (two) times daily., Disp: 14 tablet, Rfl: 0  EXAM:  VITALS per patient if applicable:  GENERAL: alert, oriented, appears well and in no acute distress  HEENT: atraumatic, conjunttiva clear, no obvious abnormalities on inspection of external nose and ears  NECK: normal movements of the head and neck  LUNGS: on inspection no signs of respiratory  distress, breathing rate appears normal, no obvious gross SOB, gasping or wheezing  CV: no obvious cyanosis  SKIN: limited video visit exam of some of trunk  (no private areas exposed) show christmas tree distribution of small erythematous oval lesions - characteristics of lesions poorly defined due to video quality  MS: moves all visible extremities without noticeable abnormality  PSYCH/NEURO: pleasant and cooperative, no obvious depression or anxiety, speech and thought  processing grossly intact  ASSESSMENT AND PLAN:  Discussed the following assessment and plan:  Skin rash  -we discussed possible serious and likely etiologies, options for evaluation and workup, limitations of telemedicine visit vs in person visit, treatment, treatment risks and precautions. Pt prefers to treat via telemedicine empirically rather than in person at this moment. Rash looks quite a bit like PR on limited exam. Did discuss other potential etiologies as well. She opted for supportive measures - antihistamine, topical steroid cream and follow up with derm if any worsening, new symptoms, spreading or not resolving as expected.  Did let this patient know that I only do telemedicine on Tuesdays and Thursdays for . Advised to schedule follow up visit with PCP or [facility] if any further questions or concerns to avoid delays in care.   I discussed the assessment and treatment plan with the patient. The patient was provided an opportunity to ask questions and all were answered. The patient agreed with the plan and demonstrated an understanding of the instructions.     Terressa Koyanagi, DO

## 2020-04-23 ENCOUNTER — Other Ambulatory Visit: Payer: Self-pay | Admitting: Emergency Medicine

## 2020-04-23 DIAGNOSIS — J4520 Mild intermittent asthma, uncomplicated: Secondary | ICD-10-CM

## 2020-04-23 MED ORDER — ALBUTEROL SULFATE HFA 108 (90 BASE) MCG/ACT IN AERS: 2.0000 | INHALATION_SPRAY | Freq: Four times a day (QID) | RESPIRATORY_TRACT | 3 refills | Status: AC | PRN

## 2020-08-08 ENCOUNTER — Other Ambulatory Visit: Payer: Self-pay | Admitting: Family

## 2020-08-08 DIAGNOSIS — J4520 Mild intermittent asthma, uncomplicated: Secondary | ICD-10-CM

## 2021-01-21 ENCOUNTER — Telehealth: Payer: Self-pay | Admitting: Physical Medicine and Rehabilitation

## 2021-01-21 NOTE — Telephone Encounter (Signed)
Pt called requesting a call back to set back injections for her and her husband. Both pt's had injections in back. I will note husband acc also. Please call pt about this matter at [phone]

## 2021-02-21 ENCOUNTER — Ambulatory Visit: Payer: Self-pay

## 2021-02-21 ENCOUNTER — Ambulatory Visit (INDEPENDENT_AMBULATORY_CARE_PROVIDER_SITE_OTHER): Payer: 59 | Admitting: Physical Medicine and Rehabilitation

## 2021-02-21 ENCOUNTER — Other Ambulatory Visit: Payer: Self-pay

## 2021-02-21 VITALS — BP 116/77 | HR 76

## 2021-02-21 DIAGNOSIS — M5116 Intervertebral disc disorders with radiculopathy, lumbar region: Secondary | ICD-10-CM | POA: Diagnosis not present

## 2021-02-21 DIAGNOSIS — M5416 Radiculopathy, lumbar region: Secondary | ICD-10-CM

## 2021-02-21 MED ORDER — METHYLPREDNISOLONE ACETATE 80 MG/ML IJ SUSP
80.0000 mg | Freq: Once | INTRAMUSCULAR | Status: AC
  Administered 2021-02-21: 80 mg

## 2021-02-21 NOTE — Patient Instructions (Addendum)

## 2021-02-21 NOTE — Procedures (Signed)
Lumbar Epidural Steroid Injection - Interlaminar Approach with Fluoroscopic Guidance  Patient: Allison Kane      Date of Birth: 1985/04/15 MRN: [id number] PCP: Brunetta Jeans, PA-C      Visit Date: 02/21/2021   Universal Protocol:     Consent Given By: the patient  Position: PRONE  Additional Comments: Vital signs were monitored before and after the procedure. Patient was prepped and draped in the usual sterile fashion. The correct patient, procedure, and site was verified.   Injection Procedure Details:   Procedure diagnoses: Lumbar radiculopathy [M54.16]   Meds Administered:  Meds ordered this encounter  Medications   methylPREDNISolone acetate (DEPO-MEDROL) injection 80 mg     Laterality: Left  Location/Site:  L5-S1  Needle: 3.5 in., 20 ga. Tuohy  Needle Placement: Paramedian epidural  Findings:   -Comments: Excellent flow of contrast into the epidural space.  Procedure Details: Using a paramedian approach from the side mentioned above, the region overlying the inferior lamina was localized under fluoroscopic visualization and the soft tissues overlying this structure were infiltrated with 4 ml. of 1% Lidocaine without Epinephrine. The Tuohy needle was inserted into the epidural space using a paramedian approach.   The epidural space was localized using loss of resistance along with counter oblique bi-planar fluoroscopic views.  After negative aspirate for air, blood, and CSF, a 2 ml. volume of Isovue-250 was injected into the epidural space and the flow of contrast was observed. Radiographs were obtained for documentation purposes.    The injectate was administered into the level noted above.   Additional Comments:  No complications occurred Dressing: 2 x 2 sterile gauze and Band-Aid    Post-procedure details: Patient was observed during the procedure. Post-procedure instructions were reviewed.  Patient left the clinic in stable condition.

## 2021-02-21 NOTE — Progress Notes (Signed)
Pt state lower back pain that travels her left leg and foot. Pt state walking, standing and laying down makes the pain worse. Pt state when her is walking her foot goes numb. Pt state she uses heat and take over the counter pain meds to help ease her pain. Pt has hx of inj on 03/11/20 pt state it helped.  Numeric Pain Rating Scale and Functional Assessment Average Pain 2   In the last MONTH (on 0-10 scale) has pain interfered with the following?  1. General activity like being  able to carry out your everyday physical activities such as walking, climbing stairs, carrying groceries, or moving a chair?  Rating(8)   +Driver, -BT, -Dye Allergies.

## 2021-02-21 NOTE — Progress Notes (Signed)
Allison Kane - 36 y.o. female MRN [id number]  Date of birth: 27-Nov-1985  Office Visit Note: Visit Date: 02/21/2021 PCP: Waldon Merl, PA-C Referred by: Waldon Merl, PA-C  Subjective: Chief Complaint  Patient presents with   Lower Back - Pain   Left Leg - Pain   Left Foot - Pain   HPI:  Allison Kane is a 36 y.o. female who comes in today for planned repeat Left L5-S1  Lumbar Interlaminar epidural steroid injection with fluoroscopic guidance.  The patient has failed conservative care including home exercise, medications, time and activity modification.  This injection will be diagnostic and hopefully therapeutic.  Please see requesting physician notes for further details and justification. Patient received more than 50% pain relief from prior injection. MRI reviewed with images and spine model.  MRI reviewed in the note below.   Referring: Dr. Casimiro Needle Hilts   ROS Otherwise per HPI.  Assessment & Plan: Visit Diagnoses:    ICD-10-CM   1. Lumbar radiculopathy  M54.16 XR C-ARM NO REPORT    Epidural Steroid injection    methylPREDNISolone acetate (DEPO-MEDROL) injection 80 mg    2. Radiculopathy due to lumbar intervertebral disc disorder  M51.16 XR C-ARM NO REPORT    Epidural Steroid injection    methylPREDNISolone acetate (DEPO-MEDROL) injection 80 mg      Plan: No additional findings.   Meds & Orders:  Meds ordered this encounter  Medications   methylPREDNISolone acetate (DEPO-MEDROL) injection 80 mg    Orders Placed This Encounter  Procedures   XR C-ARM NO REPORT   Epidural Steroid injection    Follow-up: Return if symptoms worsen or fail to improve.   Procedures: No procedures performed  Lumbar Epidural Steroid Injection - Interlaminar Approach with Fluoroscopic Guidance  Patient: Allison Kane      Date of Birth: 24-Dec-1985 MRN: [id number] PCP: Waldon Merl, PA-C      Visit Date: 02/21/2021   Universal Protocol:     Consent  Given By: the patient  Position: PRONE  Additional Comments: Vital signs were monitored before and after the procedure. Patient was prepped and draped in the usual sterile fashion. The correct patient, procedure, and site was verified.   Injection Procedure Details:   Procedure diagnoses: Lumbar radiculopathy [M54.16]   Meds Administered:  Meds ordered this encounter  Medications   methylPREDNISolone acetate (DEPO-MEDROL) injection 80 mg     Laterality: Left  Location/Site:  L5-S1  Needle: 3.5 in., 20 ga. Tuohy  Needle Placement: Paramedian epidural  Findings:   -Comments: Excellent flow of contrast into the epidural space.  Procedure Details: Using a paramedian approach from the side mentioned above, the region overlying the inferior lamina was localized under fluoroscopic visualization and the soft tissues overlying this structure were infiltrated with 4 ml. of 1% Lidocaine without Epinephrine. The Tuohy needle was inserted into the epidural space using a paramedian approach.   The epidural space was localized using loss of resistance along with counter oblique bi-planar fluoroscopic views.  After negative aspirate for air, blood, and CSF, a 2 ml. volume of Isovue-250 was injected into the epidural space and the flow of contrast was observed. Radiographs were obtained for documentation purposes.    The injectate was administered into the level noted above.   Additional Comments:  No complications occurred Dressing: 2 x 2 sterile gauze and Band-Aid    Post-procedure details: Patient was observed during the procedure. Post-procedure instructions were reviewed.  Patient left the  clinic in stable condition.   Clinical History: MRI LUMBAR SPINE WITHOUT CONTRAST   TECHNIQUE: Multiplanar, multisequence MR imaging of the lumbar spine was performed. No intravenous contrast was administered.   COMPARISON:  Plain films lumbar spine 05/04/2017.    FINDINGS: Segmentation:  Standard.   Alignment:  Mild convex right scoliosis noted.  No listhesis.   Vertebrae: Height and signal are normal. No pars interarticularis defect.   Conus medullaris and cauda equina: Conus extends to the L1-2 level. Conus and cauda equina appear normal.   Paraspinal and other soft tissues: Negative.   Disc levels:   The T11-12 to L4-5 levels are negative.   L5-S1: There is a broad-based central and right side disc protrusion. The disc encroaches on the right S1 root and slightly indents the ventral thecal sac. The foramina are open.   IMPRESSION: Broad-based central and eccentric to the right protrusion at L5-S1 encroaches on the right S1 in the subarticular recesses slightly indents the ventral thecal sac.   Mild convex right scoliosis.     Electronically Signed   By: Drusilla Kanner M.D.   On: 06/02/2017 09:19     Objective:  VS:  HT:     WT:    BMI:      BP:116/77   HR:76bpm   TEMP: ( )   RESP:  Physical Exam Vitals and nursing note reviewed.  Constitutional:      General: She is not in acute distress.    Appearance: Normal appearance. She is not ill-appearing.  HENT:     Head: Normocephalic and atraumatic.     Right Ear: External ear normal.     Left Ear: External ear normal.  Eyes:     Extraocular Movements: Extraocular movements intact.  Cardiovascular:     Rate and Rhythm: Normal rate.     Pulses: Normal pulses.  Pulmonary:     Effort: Pulmonary effort is normal. No respiratory distress.  Abdominal:     General: There is no distension.     Palpations: Abdomen is soft.  Musculoskeletal:        General: Tenderness present.     Cervical back: Neck supple.     Right lower leg: No edema.     Left lower leg: No edema.     Comments: Patient has good distal strength with no pain over the greater trochanters.  No clonus or focal weakness.  Skin:    Findings: No erythema, lesion or rash.  Neurological:     General: No focal  deficit present.     Mental Status: She is alert and oriented to person, place, and time.     Sensory: No sensory deficit.     Motor: No weakness or abnormal muscle tone.     Coordination: Coordination normal.  Psychiatric:        Mood and Affect: Mood normal.        Behavior: Behavior normal.     Imaging: No results found.

## 2021-07-13 ENCOUNTER — Other Ambulatory Visit: Payer: Self-pay | Admitting: Physical Medicine and Rehabilitation

## 2021-07-13 DIAGNOSIS — M5416 Radiculopathy, lumbar region: Secondary | ICD-10-CM

## 2021-07-13 NOTE — Progress Notes (Signed)
Spoke with patient via telephone, reports bilateral lower back pain radiating down right leg. States right L5-S1 injection in January of 2022 helped significantly and would like to repeat. I did place referral today, if her pain persists after injection I do think it would be time for new MRI imaging.

## 2021-08-11 ENCOUNTER — Encounter: Payer: 59 | Admitting: Physical Medicine and Rehabilitation

## 2021-08-11 ENCOUNTER — Telehealth: Payer: Self-pay | Admitting: Physical Medicine and Rehabilitation

## 2021-08-11 NOTE — Telephone Encounter (Signed)
Patient called advised she need to cancel her appointment. Patient said she will call back at a later date and reschedule it. The number to contact patient if needed is [phone]

## 2021-09-12 ENCOUNTER — Ambulatory Visit (INDEPENDENT_AMBULATORY_CARE_PROVIDER_SITE_OTHER): Payer: 59 | Admitting: Physical Medicine and Rehabilitation

## 2021-09-12 ENCOUNTER — Encounter: Payer: Self-pay | Admitting: Physical Medicine and Rehabilitation

## 2021-09-12 ENCOUNTER — Ambulatory Visit: Payer: Self-pay

## 2021-09-12 VITALS — BP 115/74 | HR 78

## 2021-09-12 DIAGNOSIS — M5416 Radiculopathy, lumbar region: Secondary | ICD-10-CM

## 2021-09-12 MED ORDER — METHYLPREDNISOLONE ACETATE 80 MG/ML IJ SUSP
80.0000 mg | Freq: Once | INTRAMUSCULAR | Status: AC
  Administered 2021-09-12: 80 mg

## 2021-09-12 NOTE — Patient Instructions (Signed)

## 2021-09-12 NOTE — Progress Notes (Signed)
Pt state lower back pain that travels her left leg and foot. Pt state walking, standing and laying down makes the pain worse. Pt state when her is walking her foot goes numb. Pt state she uses heat and take over the counter pain meds to help ease her pain.  Numeric Pain Rating Scale and Functional Assessment Average Pain 3   In the last MONTH (on 0-10 scale) has pain interfered with the following?  1. General activity like being  able to carry out your everyday physical activities such as walking, climbing stairs, carrying groceries, or moving a chair?  Rating(8)   +Driver, -BT, -Dye Allergies.

## 2021-09-12 NOTE — Progress Notes (Signed)
Allison Kane - 36 y.o. female MRN [id number]  Date of birth: 1985/11/28  Office Visit Note: Visit Date: 09/12/2021 PCP: No primary care provider on file. Referred by: Juanda Chance, NP  Subjective: Chief Complaint  Patient presents with   Lower Back - Pain   Left Leg - Pain   Left Foot - Numbness   HPI:  Allison Kane is a 36 y.o. female who comes in today at the request of Ellin Goodie, FNP for planned Right L5-S1 Lumbar Interlaminar epidural steroid injection with fluoroscopic guidance.  The patient has failed conservative care including home exercise, medications, time and activity modification.  This injection will be diagnostic and hopefully therapeutic.  Please see requesting physician notes for further details and justification.   ROS Otherwise per HPI.  Assessment & Plan: Visit Diagnoses:    ICD-10-CM   1. Lumbar radiculopathy  M54.16 XR C-ARM NO REPORT    Epidural Steroid injection    methylPREDNISolone acetate (DEPO-MEDROL) injection 80 mg      Plan: No additional findings.   Meds & Orders:  Meds ordered this encounter  Medications   methylPREDNISolone acetate (DEPO-MEDROL) injection 80 mg    Orders Placed This Encounter  Procedures   XR C-ARM NO REPORT   Epidural Steroid injection    Follow-up: Return for visit to requesting provider as needed.   Procedures: No procedures performed  Lumbar Epidural Steroid Injection - Interlaminar Approach with Fluoroscopic Guidance  Patient: Allison Kane      Date of Birth: [birth year] MRN: [id number] PCP: No primary care provider on file.      Visit Date: 09/12/2021   Universal Protocol:     Consent Given By: the patient  Position: PRONE  Additional Comments: Vital signs were monitored before and after the procedure. Patient was prepped and draped in the usual sterile fashion. The correct patient, procedure, and site was verified.   Injection Procedure Details:   Procedure diagnoses:  Lumbar radiculopathy [M54.16]   Meds Administered:  Meds ordered this encounter  Medications   methylPREDNISolone acetate (DEPO-MEDROL) injection 80 mg     Laterality: Right  Location/Site:  L5-S1  Needle: 3.5 in., 20 ga. Tuohy  Needle Placement: Paramedian epidural  Findings:   -Comments: Excellent flow of contrast into the epidural space.  Procedure Details: Using a paramedian approach from the side mentioned above, the region overlying the inferior lamina was localized under fluoroscopic visualization and the soft tissues overlying this structure were infiltrated with 4 ml. of 1% Lidocaine without Epinephrine. The Tuohy needle was inserted into the epidural space using a paramedian approach.   The epidural space was localized using loss of resistance along with counter oblique bi-planar fluoroscopic views.  After negative aspirate for air, blood, and CSF, a 2 ml. volume of Isovue-250 was injected into the epidural space and the flow of contrast was observed. Radiographs were obtained for documentation purposes.    The injectate was administered into the level noted above.   Additional Comments:  The patient tolerated the procedure well Dressing: 2 x 2 sterile gauze and Band-Aid    Post-procedure details: Patient was observed during the procedure. Post-procedure instructions were reviewed.  Patient left the clinic in stable condition.   Clinical History: No specialty comments available.     Objective:  VS:  HT:    WT:   BMI:     BP:115/74  HR:78bpm  TEMP: ( )  RESP:  Physical Exam Vitals and nursing note reviewed.  Constitutional:      General: She is not in acute distress.    Appearance: Normal appearance. She is not ill-appearing.  HENT:     Head: Normocephalic and atraumatic.     Right Ear: External ear normal.     Left Ear: External ear normal.  Eyes:     Extraocular Movements: Extraocular movements intact.  Cardiovascular:     Rate and Rhythm:  Normal rate.     Pulses: Normal pulses.  Pulmonary:     Effort: Pulmonary effort is normal. No respiratory distress.  Abdominal:     General: There is no distension.     Palpations: Abdomen is soft.  Musculoskeletal:        General: Tenderness present.     Cervical back: Neck supple.     Right lower leg: No edema.     Left lower leg: No edema.     Comments: Patient has good distal strength with no pain over the greater trochanters.  No clonus or focal weakness.  Skin:    Findings: No erythema, lesion or rash.  Neurological:     General: No focal deficit present.     Mental Status: She is alert and oriented to person, place, and time.     Sensory: No sensory deficit.     Motor: No weakness or abnormal muscle tone.     Coordination: Coordination normal.  Psychiatric:        Mood and Affect: Mood normal.        Behavior: Behavior normal.      Imaging: XR C-ARM NO REPORT  Result Date: 09/12/2021 Please see Notes tab for imaging impression.

## 2021-09-12 NOTE — Procedures (Signed)
Lumbar Epidural Steroid Injection - Interlaminar Approach with Fluoroscopic Guidance  Patient: Allison Kane      Date of Birth: October 11, 1985 MRN: [id number] PCP: No primary care provider on file.      Visit Date: 09/12/2021   Universal Protocol:     Consent Given By: the patient  Position: PRONE  Additional Comments: Vital signs were monitored before and after the procedure. Patient was prepped and draped in the usual sterile fashion. The correct patient, procedure, and site was verified.   Injection Procedure Details:   Procedure diagnoses: Lumbar radiculopathy [M54.16]   Meds Administered:  Meds ordered this encounter  Medications   methylPREDNISolone acetate (DEPO-MEDROL) injection 80 mg     Laterality: Right  Location/Site:  L5-S1  Needle: 3.5 in., 20 ga. Tuohy  Needle Placement: Paramedian epidural  Findings:   -Comments: Excellent flow of contrast into the epidural space.  Procedure Details: Using a paramedian approach from the side mentioned above, the region overlying the inferior lamina was localized under fluoroscopic visualization and the soft tissues overlying this structure were infiltrated with 4 ml. of 1% Lidocaine without Epinephrine. The Tuohy needle was inserted into the epidural space using a paramedian approach.   The epidural space was localized using loss of resistance along with counter oblique bi-planar fluoroscopic views.  After negative aspirate for air, blood, and CSF, a 2 ml. volume of Isovue-250 was injected into the epidural space and the flow of contrast was observed. Radiographs were obtained for documentation purposes.    The injectate was administered into the level noted above.   Additional Comments:  The patient tolerated the procedure well Dressing: 2 x 2 sterile gauze and Band-Aid    Post-procedure details: Patient was observed during the procedure. Post-procedure instructions were reviewed.  Patient left the clinic in  stable condition.

## 2022-06-27 ENCOUNTER — Telehealth: Payer: Self-pay

## 2022-06-27 NOTE — Telephone Encounter (Signed)
Spoke with patient and she is requesting another injection. She is having same pain and last injection lasted up until recently. No new injuries, accidents or falls. Please advise

## 2022-06-27 NOTE — Telephone Encounter (Signed)
Spoke with patient and scheduled OV for 06/29/22.

## 2022-06-28 ENCOUNTER — Telehealth: Payer: Self-pay | Admitting: Physical Medicine and Rehabilitation

## 2022-06-28 NOTE — Telephone Encounter (Signed)
Patient called needing to reschedule her appointment. The number to contact patient is [phone]

## 2022-06-28 NOTE — Telephone Encounter (Signed)
Spoke with patient and she is sick. Patient will return call to clinic once she is feeling better

## 2022-06-29 ENCOUNTER — Ambulatory Visit: Payer: 59 | Admitting: Physical Medicine and Rehabilitation

## 2022-07-27 ENCOUNTER — Encounter (HOSPITAL_BASED_OUTPATIENT_CLINIC_OR_DEPARTMENT_OTHER): Payer: Self-pay | Admitting: Obstetrics and Gynecology

## 2022-07-28 ENCOUNTER — Encounter (HOSPITAL_BASED_OUTPATIENT_CLINIC_OR_DEPARTMENT_OTHER): Payer: Self-pay | Admitting: Obstetrics and Gynecology

## 2022-07-28 NOTE — Progress Notes (Signed)
Spoke w/ via phone for pre-op interview--- pt Lab needs dos----  urine preg             Lab results------ no COVID test -----patient states asymptomatic no test needed Arrive at ------- 1315 on 08-14-2022 NPO after MN NO Solid Food.  Clear liquids from MN until--- 1215 Med rec completed Medications to take morning of surgery ----- vienva, claritin, prevacid Diabetic medication ----- n/a Patient instructed no nail polish to be worn day of surgery Patient instructed to bring photo id and insurance card day of surgery Patient aware to have Driver (ride ) / caregiver    for 24 hours after surgery -- husband, derrick Patient Special Instructions ----- n/a Pre-Op special Instructions -----  sent inbox message to dr bovard in epic, requested orders Patient verbalized understanding of instructions that were given at this phone interview. Patient denies shortness of breath, chest pain, fever, cough at this phone interview.

## 2022-08-13 NOTE — H&P (Signed)
Allison Kane is an 37 y.o. female [phone] with vertical septum vs synechaie with AUB, desires ablation and resection.  D/W pt r/b/a and process/expectations.    Pertinent Gynecological History: Last pap 1/20 WNL HR HPV neg, h/o LEEP\ No STI G2P1102 - SVD x 2 Menstrual History:  Patient's last menstrual period was 07/22/2022 (approximate).    Past Medical History:  Diagnosis Date   Disorder of uterus    GERD (gastroesophageal reflux disease)    History of cervical dysplasia 2011   s/p  leep   IBS (irritable bowel syndrome)    per pt intermittant   Migraines    Mild intermittent asthma    Seasonal allergic rhinitis     Past Surgical History:  Procedure Laterality Date   CERVICAL BIOPSY  W/ LOOP ELECTRODE EXCISION  2011   HGSIL   CESAREAN SECTION  2013   CESAREAN SECTION N/A 01/12/2014   Procedure: CESAREAN SECTION;  Surgeon: Sherian Rein, MD;  Location: [facility];  Service: Obstetrics;  Laterality: N/A;   COMBINED REDUCTION MAMMAPLASTY W/ ABDOMINOPLASTY  2021   bilateral breast implants    Family History  Problem Relation Age of Onset   Cancer Maternal Aunt    Osteoporosis Maternal Grandmother    Arthritis Maternal Grandmother    Birth defects Maternal Grandmother    Cancer Maternal Grandmother    Hyperlipidemia Maternal Grandmother    Hyperlipidemia Mother    Diabetes Father    Hyperlipidemia Father    Hyperlipidemia Brother    Arthritis Paternal Grandmother    Asthma Paternal Grandmother    Cancer Paternal Grandmother        Colon, Metastatic   Hearing loss Paternal Grandmother    Hyperlipidemia Paternal Grandmother    Hypertension Paternal Grandmother     Social History:  reports that she has been smoking cigarettes. She has a 7.50 pack-year smoking history. She has never used smokeless tobacco. She reports current alcohol use. She reports that she does not use drugs. Married, self-employed - owns business  Allergies: Allergies  Allergen  Reactions   Peanut-Containing Drug Products Swelling    Allergic to all nuts   Wasp Venom Anaphylaxis and Swelling   Latex Swelling   Penicillins Other (See Comments)    Reaction:  High fever  Clindamycin  Meds: albuterol, OCP  Review of Systems  Constitutional: Negative.   Respiratory: Negative.    Cardiovascular: Negative.   Gastrointestinal: Negative.   Genitourinary:  Positive for menstrual problem.  Musculoskeletal: Negative.   Neurological: Negative.   Psychiatric/Behavioral: Negative.      Height 5\' 4"  (1.626 m), weight 56.7 kg, last menstrual period 07/22/2022. Physical Exam Constitutional:      Appearance: Normal appearance. She is obese.  HENT:     Head: Normocephalic and atraumatic.  Cardiovascular:     Rate and Rhythm: Normal rate and regular rhythm.  Pulmonary:     Effort: Pulmonary effort is normal.     Breath sounds: Normal breath sounds.  Abdominal:     General: Bowel sounds are normal.     Palpations: Abdomen is soft.  Genitourinary:    General: Normal vulva.     Rectum: Normal.  Musculoskeletal:        General: Normal range of motion.     Cervical back: Normal range of motion and neck supple.  Skin:    General: Skin is warm and dry.  Neurological:     General: No focal deficit present.     Mental Status: She  is alert and oriented to person, place, and time.  Psychiatric:        Mood and Affect: Mood normal.        Behavior: Behavior normal.    US/SIUS - complete septum vs long vertical synechaie.  On previous US IUD in normal (arms out) position  Assessment/Plan: 36yoG2P2 with synechaie vs septum for resection by hysteroscope and ablation D/w pt r/b/a, process and expectations, Questions answered - will proceed   Abubakr Wieman Bovard-Stuckert 08/13/2022, 2:50 PM

## 2022-08-14 ENCOUNTER — Other Ambulatory Visit: Payer: Self-pay

## 2022-08-14 ENCOUNTER — Ambulatory Visit (HOSPITAL_BASED_OUTPATIENT_CLINIC_OR_DEPARTMENT_OTHER)
Admission: RE | Admit: 2022-08-14 | Discharge: 2022-08-14 | Disposition: A | Discharge status: Home / Self Care | Payer: 59 | Source: Ambulatory Visit | Attending: Obstetrics and Gynecology | Admitting: Obstetrics and Gynecology

## 2022-08-14 ENCOUNTER — Encounter (HOSPITAL_BASED_OUTPATIENT_CLINIC_OR_DEPARTMENT_OTHER): Payer: Self-pay | Admitting: Obstetrics and Gynecology

## 2022-08-14 ENCOUNTER — Ambulatory Visit (HOSPITAL_BASED_OUTPATIENT_CLINIC_OR_DEPARTMENT_OTHER): Payer: 59 | Admitting: Certified Registered Nurse Anesthetist

## 2022-08-14 ENCOUNTER — Encounter (HOSPITAL_BASED_OUTPATIENT_CLINIC_OR_DEPARTMENT_OTHER)
Admission: RE | Disposition: A | Discharge status: Home / Self Care | Payer: Self-pay | Source: Ambulatory Visit | Attending: Obstetrics and Gynecology

## 2022-08-14 DIAGNOSIS — N939 Abnormal uterine and vaginal bleeding, unspecified: Secondary | ICD-10-CM | POA: Diagnosis present

## 2022-08-14 DIAGNOSIS — Z01818 Encounter for other preprocedural examination: Secondary | ICD-10-CM

## 2022-08-14 DIAGNOSIS — F17210 Nicotine dependence, cigarettes, uncomplicated: Secondary | ICD-10-CM | POA: Insufficient documentation

## 2022-08-14 DIAGNOSIS — N840 Polyp of corpus uteri: Secondary | ICD-10-CM | POA: Insufficient documentation

## 2022-08-14 DIAGNOSIS — Q5128 Other doubling of uterus, other specified: Secondary | ICD-10-CM

## 2022-08-14 DIAGNOSIS — N859 Noninflammatory disorder of uterus, unspecified: Secondary | ICD-10-CM

## 2022-08-14 DIAGNOSIS — Q5122 Other partial doubling of uterus: Secondary | ICD-10-CM | POA: Diagnosis present

## 2022-08-14 HISTORY — PX: DILATATION & CURETTAGE/HYSTEROSCOPY WITH MYOSURE: SHX6511

## 2022-08-14 HISTORY — DX: Other complications of anesthesia, initial encounter: T88.59XA

## 2022-08-14 HISTORY — DX: Other seasonal allergic rhinitis: J30.2

## 2022-08-14 HISTORY — DX: Mild intermittent asthma, uncomplicated: J45.20

## 2022-08-14 HISTORY — DX: Noninflammatory disorder of uterus, unspecified: N85.9

## 2022-08-14 LAB — CBC
HCT: 41.7 % (ref 36.0–46.0)
Hemoglobin: 13.7 g/dL (ref 12.0–15.0)
MCH: 32.9 pg (ref 26.0–34.0)
MCHC: 32.9 g/dL (ref 30.0–36.0)
MCV: 100 fL (ref 80.0–100.0)
Platelets: 222 10*3/uL (ref 150–400)
RBC: 4.17 MIL/uL (ref 3.87–5.11)
RDW: 13.5 % (ref 11.5–15.5)
WBC: 11.8 10*3/uL — ABNORMAL HIGH (ref 4.0–10.5)
nRBC: 0 % (ref 0.0–0.2)

## 2022-08-14 LAB — COMPREHENSIVE METABOLIC PANEL
ALT: 14 U/L (ref 0–44)
AST: 26 U/L (ref 15–41)
Albumin: 3.8 g/dL (ref 3.5–5.0)
Alkaline Phosphatase: 39 U/L (ref 38–126)
Anion gap: 10 (ref 5–15)
BUN: 11 mg/dL (ref 6–20)
CO2: 20 mmol/L — ABNORMAL LOW (ref 22–32)
Calcium: 9 mg/dL (ref 8.9–10.3)
Chloride: 108 mmol/L (ref 98–111)
Creatinine, Ser: 0.74 mg/dL (ref 0.44–1.00)
GFR, Estimated: >60 mL/min (ref >60)
Glucose, Bld: 82 mg/dL (ref 70–99)
Potassium: 4.7 mmol/L (ref 3.5–5.1)
Sodium: 138 mmol/L (ref 135–145)
Total Bilirubin: 1.1 mg/dL (ref 0.3–1.2)
Total Protein: 7 g/dL (ref 6.5–8.1)

## 2022-08-14 LAB — POCT PREGNANCY, URINE: Preg Test, Ur: NEGATIVE

## 2022-08-14 SURGERY — DILATATION & CURETTAGE/HYSTEROSCOPY WITH MYOSURE
Anesthesia: General | Site: Uterus

## 2022-08-14 MED ORDER — LIDOCAINE HCL (PF) 2 % IJ SOLN
INTRAMUSCULAR | Status: AC
  Filled 2022-08-14: qty 5

## 2022-08-14 MED ORDER — PROPOFOL 10 MG/ML IV BOLUS
INTRAVENOUS | Status: AC
  Filled 2022-08-14: qty 20

## 2022-08-14 MED ORDER — KETOROLAC TROMETHAMINE 30 MG/ML IJ SOLN
INTRAMUSCULAR | Status: DC | PRN
  Administered 2022-08-14: 30 mg via INTRAVENOUS

## 2022-08-14 MED ORDER — OXYCODONE HCL 5 MG PO TABS: 5.0000 mg | ORAL_TABLET | Freq: Four times a day (QID) | ORAL | 0 refills | Status: DC | PRN

## 2022-08-14 MED ORDER — SODIUM CHLORIDE 0.9 % IR SOLN
Status: DC | PRN
  Administered 2022-08-14: 3000 mL

## 2022-08-14 MED ORDER — LIDOCAINE HCL 1 % IJ SOLN
INTRAMUSCULAR | Status: DC | PRN
  Administered 2022-08-14: 10 mL

## 2022-08-14 MED ORDER — PHENYLEPHRINE 80 MCG/ML (10ML) SYRINGE FOR IV PUSH (FOR BLOOD PRESSURE SUPPORT)
PREFILLED_SYRINGE | INTRAVENOUS | Status: DC | PRN
  Administered 2022-08-14 (×2): 80 ug via INTRAVENOUS

## 2022-08-14 MED ORDER — FENTANYL CITRATE (PF) 100 MCG/2ML IJ SOLN: 25.0000 ug | INTRAMUSCULAR | Status: DC | PRN

## 2022-08-14 MED ORDER — KETOROLAC TROMETHAMINE 30 MG/ML IJ SOLN
INTRAMUSCULAR | Status: AC
  Filled 2022-08-14: qty 1

## 2022-08-14 MED ORDER — FENTANYL CITRATE (PF) 100 MCG/2ML IJ SOLN
INTRAMUSCULAR | Status: DC | PRN
  Administered 2022-08-14 (×2): 50 ug via INTRAVENOUS

## 2022-08-14 MED ORDER — LIDOCAINE HCL (CARDIAC) PF 100 MG/5ML IV SOSY
PREFILLED_SYRINGE | INTRAVENOUS | Status: DC | PRN
  Administered 2022-08-14: 60 mg via INTRAVENOUS

## 2022-08-14 MED ORDER — AMISULPRIDE (ANTIEMETIC) 5 MG/2ML IV SOLN: 10.0000 mg | Freq: Once | INTRAVENOUS | Status: DC | PRN

## 2022-08-14 MED ORDER — IBUPROFEN 800 MG PO TABS: 800.0000 mg | ORAL_TABLET | Freq: Three times a day (TID) | ORAL | 1 refills | Status: DC | PRN

## 2022-08-14 MED ORDER — GLYCOPYRROLATE PF 0.2 MG/ML IJ SOSY
PREFILLED_SYRINGE | INTRAMUSCULAR | Status: AC
  Filled 2022-08-14: qty 1

## 2022-08-14 MED ORDER — ONDANSETRON HCL 4 MG/2ML IJ SOLN
INTRAMUSCULAR | Status: DC | PRN
  Administered 2022-08-14: 4 mg via INTRAVENOUS

## 2022-08-14 MED ORDER — DEXAMETHASONE SODIUM PHOSPHATE 4 MG/ML IJ SOLN
INTRAMUSCULAR | Status: DC | PRN
  Administered 2022-08-14: 10 mg via INTRAVENOUS

## 2022-08-14 MED ORDER — PHENYLEPHRINE 80 MCG/ML (10ML) SYRINGE FOR IV PUSH (FOR BLOOD PRESSURE SUPPORT)
PREFILLED_SYRINGE | INTRAVENOUS | Status: AC
  Filled 2022-08-14: qty 10

## 2022-08-14 MED ORDER — LACTATED RINGERS IV SOLN: INTRAVENOUS | Status: DC

## 2022-08-14 MED ORDER — DEXAMETHASONE SODIUM PHOSPHATE 10 MG/ML IJ SOLN
INTRAMUSCULAR | Status: AC
  Filled 2022-08-14: qty 1

## 2022-08-14 MED ORDER — MIDAZOLAM HCL 2 MG/2ML IJ SOLN
INTRAMUSCULAR | Status: DC | PRN
  Administered 2022-08-14: 2 mg via INTRAVENOUS

## 2022-08-14 MED ORDER — FENTANYL CITRATE (PF) 100 MCG/2ML IJ SOLN
INTRAMUSCULAR | Status: AC
  Filled 2022-08-14: qty 2

## 2022-08-14 MED ORDER — GLYCOPYRROLATE PF 0.2 MG/ML IJ SOSY
PREFILLED_SYRINGE | INTRAMUSCULAR | Status: DC | PRN
  Administered 2022-08-14: .2 mg via INTRAVENOUS

## 2022-08-14 MED ORDER — ACETAMINOPHEN 500 MG PO TABS
1000.0000 mg | ORAL_TABLET | ORAL | Status: AC
  Administered 2022-08-14: 1000 mg via ORAL

## 2022-08-14 MED ORDER — ONDANSETRON HCL 4 MG/2ML IJ SOLN
INTRAMUSCULAR | Status: AC
  Filled 2022-08-14: qty 2

## 2022-08-14 MED ORDER — ACETAMINOPHEN 500 MG PO TABS
ORAL_TABLET | ORAL | Status: AC
  Filled 2022-08-14: qty 2

## 2022-08-14 MED ORDER — POVIDONE-IODINE 10 % EX SWAB: 2.0000 | Freq: Once | CUTANEOUS | Status: DC

## 2022-08-14 MED ORDER — MIDAZOLAM HCL 2 MG/2ML IJ SOLN
INTRAMUSCULAR | Status: AC
  Filled 2022-08-14: qty 2

## 2022-08-14 MED ORDER — PROPOFOL 10 MG/ML IV BOLUS
INTRAVENOUS | Status: DC | PRN
  Administered 2022-08-14: 170 mg via INTRAVENOUS
  Administered 2022-08-14: 30 mg via INTRAVENOUS

## 2022-08-14 SURGICAL SUPPLY — 24 items
ABLATOR SURESOUND NOVASURE (ABLATOR) IMPLANT
CATH FOLEY 2WAY 5CC 16FR SIL (CATHETERS) ×1 IMPLANT
CATH ROBINSON RED A/P 16FR (CATHETERS) ×1 IMPLANT
DEVICE MYOSURE LITE (MISCELLANEOUS) IMPLANT
DEVICE MYOSURE REACH (MISCELLANEOUS) IMPLANT
DILATOR CANAL MILEX (MISCELLANEOUS) IMPLANT
DRSG TELFA 3X8 NADH STRL (GAUZE/BANDAGES/DRESSINGS) ×1 IMPLANT
GAUZE 4X4 16PLY ~~LOC~~+RFID DBL (SPONGE) ×2 IMPLANT
GLOVE BIO SURGEON STRL SZ 6.5 (GLOVE) ×1 IMPLANT
GLOVE BIOGEL PI IND STRL 7.0 (GLOVE) ×1 IMPLANT
GLOVE SURG SS PI 6.5 STRL IVOR (GLOVE) ×3 IMPLANT
GOWN STRL REUS W/ TWL LRG LVL3 (GOWN DISPOSABLE) ×2 IMPLANT
GOWN STRL REUS W/TWL LRG LVL3 (GOWN DISPOSABLE) ×7 IMPLANT
IV NS IRRIG 3000ML ARTHROMATIC (IV SOLUTION) ×2 IMPLANT
KIT PROCEDURE FLUENT (KITS) ×2 IMPLANT
KIT TURNOVER CYSTO (KITS) ×2 IMPLANT
PACK VAGINAL MINOR WOMEN LF (CUSTOM PROCEDURE TRAY) ×2 IMPLANT
PAD OB MATERNITY 4.3X12.25 (PERSONAL CARE ITEMS) ×2 IMPLANT
SEAL CERVICAL OMNI LOK (ABLATOR) IMPLANT
SEAL ROD LENS SCOPE MYOSURE (ABLATOR) ×2 IMPLANT
SLEEVE SCD COMPRESS KNEE MED (STOCKING) ×2 IMPLANT
SOL PREP POV-IOD 4OZ 10% (MISCELLANEOUS) ×2 IMPLANT
SPIKE FLUID TRANSFER (MISCELLANEOUS) ×1 IMPLANT
TOWEL OR 17X24 6PK STRL BLUE (TOWEL DISPOSABLE) ×2 IMPLANT

## 2022-08-14 NOTE — Transfer of Care (Signed)
Immediate Anesthesia Transfer of Care Note  Patient: Allison Kane  Procedure(s) Performed: DILATATION & CURETTAGE/HYSTEROSCOPY (Uterus)  Patient Location: PACU  Anesthesia Type:General  Level of Consciousness: awake, alert , oriented, and patient cooperative  Airway & Oxygen Therapy: Patient Spontanous Breathing  Post-op Assessment: Report given to RN and Post -op Vital signs reviewed and stable  Post vital signs: Reviewed and stable  Last Vitals:  Vitals Value Taken Time  BP 117/69 08/14/22 1240  Temp 36.6 C 08/14/22 1240  Pulse 99 08/14/22 1240  Resp 24 08/14/22 1240  SpO2 97 % 08/14/22 1240    Last Pain:  Vitals:   08/14/22 1119  TempSrc: Oral  PainSc: 1       Patients Stated Pain Goal: 6 (08/14/22 1119)  Complications: No notable events documented.

## 2022-08-14 NOTE — Anesthesia Procedure Notes (Signed)
Procedure Name: LMA Insertion Date/Time: 08/14/2022 11:54 AM  Performed by: Bishop Limbo, CRNAPre-anesthesia Checklist: Patient identified, Emergency Drugs available, Suction available and Patient being monitored Patient Re-evaluated:Patient Re-evaluated prior to induction Oxygen Delivery Method: Circle System Utilized Preoxygenation: Pre-oxygenation with 100% oxygen Induction Type: IV induction Ventilation: Mask ventilation without difficulty LMA: LMA inserted LMA Size: 4.0 Number of attempts: 1 Placement Confirmation: positive ETCO2 Tube secured with: Tape Dental Injury: Teeth and Oropharynx as per pre-operative assessment

## 2022-08-14 NOTE — Interval H&P Note (Signed)
History and Physical Interval Note:  08/14/2022 11:32 AM  Allison Kane  has presented today for surgery, with the diagnosis of disorder of uterus.  The various methods of treatment have been discussed with the patient and family. After consideration of risks, benefits and other options for treatment, the patient has consented to  Procedure(s) with comments: DILATATION & CURETTAGE/HYSTEROSCOPY WITH MYOSURE (N/A) - myosure vs scissors for removal of septum DILATATION & CURETTAGE/HYSTEROSCOPY WITH NOVASURE ABLATION (N/A) as a surgical intervention.  The patient's history has been reviewed, patient examined, no change in status, stable for surgery.  I have reviewed the patient's chart and labs.  Questions were answered to the patient's satisfaction.     Shaelee Forni Bovard-Stuckert

## 2022-08-14 NOTE — Discharge Instructions (Addendum)
  Post Anesthesia Home Care Instructions  Activity: Get plenty of rest for the remainder of the day. A responsible individual must stay with you for 24 hours following the procedure.  For the next 24 hours, DO NOT: -Drive a car -Advertising copywriter -Drink alcoholic beverages -Take any medication unless instructed by your physician -Make any legal decisions or sign important papers.  Meals: Start with liquid foods such as gelatin or soup. Progress to regular foods as tolerated. Avoid greasy, spicy, heavy foods. If nausea and/or vomiting occur, drink only clear liquids until the nausea and/or vomiting subsides. Call your physician if vomiting continues.  Special Instructions/Symptoms: Your throat may feel dry or sore from the anesthesia or the breathing tube placed in your throat during surgery. If this causes discomfort, gargle with warm salt water. The discomfort should disappear within 24 hours.  DISCHARGE INSTRUCTIONS: HYSTEROSCOPY  The following instructions have been prepared to help you care for yourself upon your return home.  May take Ibuprofen after 8:30 PM for cramping/discomfort.  May take stool softner while taking narcotic pain medication to prevent constipation.  Drink plenty of water.  Personal hygiene:  Use sanitary pads for vaginal drainage, not tampons.  Shower the day after your procedure.  NO tub baths, pools or Jacuzzis for 2-3 weeks.  Wipe front to back after using the bathroom.  Activity and limitations:  Do NOT drive or operate any equipment for 24 hours. The effects of anesthesia are still present and drowsiness may result.  Do NOT rest in bed all day.  Walking is encouraged.  Walk up and down stairs slowly.  You may resume your normal activity in one to two days or as indicated by your physician. Sexual activity: NO intercourse for at least 2 weeks after the procedure, or as indicated by your Doctor.  Diet: Eat a light meal as desired this evening. You  may resume your usual diet tomorrow.  Return to Work: You may resume your work activities in one to two days or as indicated by Therapist, sports.  What to expect after your surgery: Expect to have vaginal bleeding/discharge for 2-3 days and spotting for up to 10 days. It is not unusual to have soreness for up to 1-2 weeks. You may have a slight burning sensation when you urinate for the first day. Mild cramps may continue for a couple of days. You may have a regular period in 2-6 weeks.  Call your doctor for any of the following:  Excessive vaginal bleeding or clotting, saturating and changing one pad every hour.  Inability to urinate 6 hours after discharge from hospital.  Pain not relieved by pain medication.  Fever of 100.4 F or greater.  Unusual vaginal discharge or odor.  Begin taking Tylenol at 7 PM as needed for cramping/discomfort.

## 2022-08-14 NOTE — Op Note (Signed)
NAME: [name], FEUER MEDICAL RECORD NO: [id number] ACCOUNT NO: [id number] DATE OF BIRTH: October 08, 1985 FACILITY: [facility] LOCATION: [facility] PHYSICIAN: Sherian Rein, MD  Operative Report   DATE OF PROCEDURE: 08/14/2022  PREOPERATIVE DIAGNOSIS: uterine polyps.  POSTOPERATIVE DIAGNOSIS: uterine polyps.  PROCEDURE PERFORMED:  Diagnostic hysteroscopy.  SURGEON:  Sherian Rein, MD  ANESTHESIA:  Local and general.  ESTIMATED BLOOD LOSS:  10 mL.  INTRAVENOUS FLUIDS:  Per anesthesia.  URINE OUTPUT:  Approximately 20 mL by in and out cath at beginning of the case.  COMPLICATIONS: Large deficit on hysteroscopy; however, a perforation was not visualized, but the procedure was abandoned for the patient's safety concerns.  PATHOLOGY:  None.  DESCRIPTION OF PROCEDURE:  After informed consent was reviewed with the patient, including risks, benefits and alternatives of the surgical procedure the patient was transferred to the operating room and placed on the table in supine position.  General  anesthesia was induced and found to be adequate.  She was then prepped and draped in normal sterile fashion, placed in the [place]in stirrups.  After an appropriate timeout was performed her bladder was drained with in and out catheter.  Using an  open-sided speculum her cervix was visualized.  A paracervical block was placed with 10 mL of 1% lidocaine.  Single tooth tenaculum was placed on the anterior lip of her cervix.  Cervix was dilated to accommodate a hysteroscope after sounding to  approximately 8 cm.  The hysteroscope was introduced and the previous ultrasound had shown a filmy, likely synechiae - this was not seen, at the fundus of her uterus a thickened area was seen, likely representing a septum and an additional ostia were visualized  at least on the left.  During this exploration of her uterus the deficit was noted to be large, investigation revealed no leaking onto the floor and  the closed system was draining well.  Decision was made at the end of the procedure given the patient's  safety concerns the instruments were removed from her vagina.  She was awakened in stable condition and transported to the recovery room.  Sponge, lap and needle counts were correct x2 per the operating room staff.   PUS D: 08/14/2022 12:47:52 pm T: 08/14/2022 3:39:00 pm  JOB: [id number]/ [id number]

## 2022-08-14 NOTE — Anesthesia Preprocedure Evaluation (Signed)
Anesthesia Evaluation  Patient identified by MRN, date of birth, ID band Patient awake    Reviewed: Allergy & Precautions, NPO status , Patient's Chart, lab work & pertinent test results  Airway Mallampati: II  TM Distance: >3 FB Neck ROM: Full    Dental  (+) Dental Advisory Given   Pulmonary asthma , Current Smoker and Patient abstained from smoking.   Pulmonary exam normal        Cardiovascular negative cardio ROS Normal cardiovascular exam     Neuro/Psych  Headaches    GI/Hepatic Neg liver ROS,GERD  ,,  Endo/Other  negative endocrine ROS    Renal/GU negative Renal ROS     Musculoskeletal   Abdominal   Peds  Hematology negative hematology ROS (+)   Anesthesia Other Findings   Reproductive/Obstetrics                             Anesthesia Physical Anesthesia Plan  ASA: 2  Anesthesia Plan: General   Post-op Pain Management: Tylenol PO (pre-op)* and Toradol IV (intra-op)*   Induction: Intravenous  PONV Risk Score and Plan: 2 and Dexamethasone, Midazolam, Ondansetron and Treatment may vary due to age or medical condition  Airway Management Planned: LMA  Additional Equipment: None  Intra-op Plan:   Post-operative Plan: Extubation in OR  Informed Consent: I have reviewed the patients History and Physical, chart, labs and discussed the procedure including the risks, benefits and alternatives for the proposed anesthesia with the patient or authorized representative who has indicated his/her understanding and acceptance.       Plan Discussed with: CRNA  Anesthesia Plan Comments:        Anesthesia Quick Evaluation

## 2022-08-14 NOTE — Brief Op Note (Signed)
08/14/2022  12:39 PM  PATIENT:  Denny Peon  37 y.o. female  PRE-OPERATIVE DIAGNOSIS:  disorder of uterus  POST-OPERATIVE DIAGNOSIS:  disorder of uterus  PROCEDURE:  Procedure(s): DILATATION & CURETTAGE/HYSTEROSCOPY (N/A)  SURGEON:  Surgeon(s) and Role:    * Bovard-Stuckert, Jewelianna Pancoast, MD - Primary  ANESTHESIA:   local and general  EBL:  10 mL IVF per anesthesia, uop 20cc  DRAINS: none   LOCAL MEDICATIONS USED:  LIDOCAINE   SPECIMEN:  No Specimen  DISPOSITION OF SPECIMEN:  PATHOLOGY  COUNTS:  YES  TOURNIQUET:  * No tourniquets in log *  DICTATION: .Other Dictation: Dictation Number [id number]  PLAN OF CARE: Discharge to home after PACU  PATIENT DISPOSITION:  PACU - hemodynamically stable.   Delay start of Pharmacological VTE agent (>24hrs) due to surgical blood loss or risk of bleeding: not applicable

## 2022-08-14 NOTE — Anesthesia Postprocedure Evaluation (Signed)
Anesthesia Post Note  Patient: Allison Kane  Procedure(s) Performed: DILATATION & CURETTAGE/HYSTEROSCOPY (Uterus)     Patient location during evaluation: PACU Anesthesia Type: General Level of consciousness: awake and alert Pain management: pain level controlled Vital Signs Assessment: post-procedure vital signs reviewed and stable Respiratory status: spontaneous breathing, nonlabored ventilation, respiratory function stable and patient connected to nasal cannula oxygen Cardiovascular status: blood pressure returned to baseline and stable Postop Assessment: no apparent nausea or vomiting Anesthetic complications: no  No notable events documented.  Last Vitals:  Vitals:   08/14/22 1315 08/14/22 1330  BP: 109/69 110/68  Pulse: 62 77  Resp: 14 15  Temp:    SpO2: 98% 99%    Last Pain:  Vitals:   08/14/22 1315  TempSrc:   PainSc: 0-No pain                 Kennieth Rad

## 2022-08-16 ENCOUNTER — Encounter (HOSPITAL_BASED_OUTPATIENT_CLINIC_OR_DEPARTMENT_OTHER): Payer: Self-pay | Admitting: Obstetrics and Gynecology

## 2023-06-11 ENCOUNTER — Encounter (HOSPITAL_COMMUNITY): Payer: Self-pay | Admitting: Obstetrics and Gynecology

## 2023-06-11 NOTE — Pre-Procedure Instructions (Signed)
 Surgical Instructions   Your procedure is scheduled on :  Monday,  06-18-2023. Report to [facility] Main Entrance "A" at 5:30  A.M., then check in with the Admitting office. Any questions or running late day of surgery: call [phone]  Questions prior to your surgery date: call [phone], Monday-Friday, 8am-4pm. If you experience any cold or flu symptoms such as cough, fever, chills, shortness of breath, etc. between now and your scheduled surgery, please notify your surgeon office.    Remember:  Do not eat any food and do not drink any liquids after midnight the night before your surgery.  This includes no water,  candy,  gum,  and mints.    Take these medicines the morning of surgery with A SIPS OF WATER : Loratadine (claritin) Lansoprazole (prevacid)   May take these medicines IF NEEDED: Albuterol  (ventolin ) inhaler  ~ Please bring your rescue inhaler with you day of surgery   One week prior to surgery, STOP taking any Aspirin (unless otherwise instructed by your surgeon) Aleve, Naproxen, Ibuprofen , Motrin , Advil , Goody's, BC's, all herbal medications, fish oil, and non-prescription vitamins.                     Do NOT Smoke (Tobacco/Vaping) and Do Not drink alcohol for 24 hours prior to your procedure.  If you use a CPAP at night, you may bring your mask/headgear for your overnight stay.   You will be asked to remove any contacts, glasses, piercing's, hearing aid's, dentures/partials prior to surgery. Please bring cases for these items if needed.    Patients discharged the day of surgery will not be allowed to drive home, and someone needs to stay with them for 24 hours.  SURGICAL WAITING ROOM VISITATION Patients may have no more than 2 support people in the waiting area - these visitors may rotate.   Pre-op nurse will coordinate an appropriate time for 1 ADULT support person, who may not rotate, to accompany patient in pre-op.  Children under the age of 67 must have  an adult with them who is not the patient and must remain in the main waiting area with an adult.  If the patient needs to stay at the hospital during part of their recovery, the visitor guidelines for inpatient rooms apply.  Please refer to the [facility] website for the visitor guidelines for any additional information.   If you received a COVID test during your pre-op visit  it is requested that you wear a mask when out in public, stay away from anyone that may not be feeling well and notify your surgeon if you develop symptoms. If you have been in contact with anyone that has tested positive in the last 10 days please notify you surgeon.      Pre-operative CHG Bathing Instructions   You can play a key role in reducing the risk of infection after surgery. Your skin needs to be as free of germs as possible. You can reduce the number of germs on your skin by washing with CHG (chlorhexidine gluconate) soap before surgery. CHG is an antiseptic soap that kills germs and continues to kill germs even after washing.   DO NOT use if you have an allergy to chlorhexidine/CHG or antibacterial soaps. If your skin becomes reddened or irritated, stop using the CHG and notify Pre-Op nurse day of surgery.  Please get dial dial soap or other antibacterial soap and shower following the instructions below.  TAKE A SHOWER THE NIGHT BEFORE SURGERY AND THE DAY OF SURGERY    Please keep in mind the following:  DO NOT shave, including legs and underarms, 48 hours prior to surgery.   You may shave your face before/day of surgery.  Place clean sheets on your bed the night before surgery Use a clean washcloth (not used since being washed) for each shower. DO NOT sleep with pet's night before surgery.  CHG Shower Instructions:  Wash your face and private area with normal soap. If you choose to wash your hair, wash first with your normal shampoo.  After you use shampoo/soap, rinse your hair and body  thoroughly to remove shampoo/soap residue.  Turn the water OFF and apply half the bottle of CHG soap to a CLEAN washcloth.  Apply CHG soap ONLY FROM YOUR NECK DOWN TO YOUR TOES (washing for 3-5 minutes)  DO NOT use CHG soap on face, private areas, open wounds, or sores.  Pay special attention to the area where your surgery is being performed.  If you are having back surgery, having someone wash your back for you may be helpful. Wait 2 minutes after CHG soap is applied, then you may rinse off the CHG soap.  Pat dry with a clean towel  Put on clean pajamas    Additional instructions for the day of surgery: DO NOT APPLY any lotions, powders, oils, deodorants (may use underarm deodorant) , cologne/  perfumes   or makeup  Do not wear jewelry /  piercing's/  metal/  permanent jewelry must be removed prior to arrival day of surgery.  (No plastic piercing) Do not wear nail polish, gel polish, artificial nails, or any other type of covering on natural nails (fingers and toes) Do not bring valuables to the hospital. [facility] is not responsible for valuables/personal belongings. Put on clean/comfortable clothes.  Please brush your teeth.  Ask your nurse before applying any prescription medications to the skin.

## 2023-06-11 NOTE — Progress Notes (Addendum)
 Spoke w/ via phone for pre-op interview--- PT Lab needs dos----  URINE PREG       Lab results------ LAB APPT 06-15-2023 @ 1300 GETTING CBC/T&S COVID test -----patient states asymptomatic no test needed Arrive at -------  0530 ON 06-18-2023 NPO after MN W/  EXCEPTION SIPS OF WATER W/ MEDS Pre-Surgery Ensure or G2:  NO  Med rec completed Medications to take morning of surgery ----- CLARITIN, PREVACID Diabetic medication ----- N/A  GLP1 agonist last dose: N/A GLP1 instructions:  Patient instructed no nail polish to be worn day of surgery Patient instructed to bring photo id and insurance card day of surgery Patient aware to have Driver (ride ) / caregiver    for 24 hours after surgery - HUSBAND, DERRICK Patient Special Instructions ----- WILL PICK UP BAG W/ SOAP AND WRITTEN INSTRUCTIONS AT LAB APPT Pre-Op special Instructions ----- SENT INBOX MESSAGE TO DR Donelda Fujita IN [facility] ,  REQUESTED PRE-OP ORDERS  Patient verbalized understanding of instructions that were given at this phone interview. Patient denies chest pain, sob, fever, cough at the interview.

## 2023-06-15 ENCOUNTER — Encounter (HOSPITAL_COMMUNITY)
Admission: RE | Admit: 2023-06-15 | Discharge: 2023-06-15 | Disposition: A | Discharge status: Home / Self Care | Source: Ambulatory Visit | Attending: Obstetrics and Gynecology | Admitting: Obstetrics and Gynecology

## 2023-06-15 DIAGNOSIS — Z01812 Encounter for preprocedural laboratory examination: Secondary | ICD-10-CM | POA: Diagnosis present

## 2023-06-15 DIAGNOSIS — Z01818 Encounter for other preprocedural examination: Secondary | ICD-10-CM

## 2023-06-15 LAB — TYPE AND SCREEN
ABO/RH(D): A NEG
Antibody Screen: NEGATIVE

## 2023-06-15 LAB — CBC
HCT: 41.4 % (ref 36.0–46.0)
Hemoglobin: 13.9 g/dL (ref 12.0–15.0)
MCH: 33.6 pg (ref 26.0–34.0)
MCHC: 33.6 g/dL (ref 30.0–36.0)
MCV: 100 fL (ref 80.0–100.0)
Platelets: 225 10*3/uL (ref 150–400)
RBC: 4.14 MIL/uL (ref 3.87–5.11)
RDW: 13 % (ref 11.5–15.5)
WBC: 7.6 10*3/uL (ref 4.0–10.5)
nRBC: 0 % (ref 0.0–0.2)

## 2023-06-15 LAB — COMPREHENSIVE METABOLIC PANEL WITH GFR
ALT: 16 U/L (ref 0–44)
AST: 25 U/L (ref 15–41)
Albumin: 4.4 g/dL (ref 3.5–5.0)
Alkaline Phosphatase: 37 U/L — ABNORMAL LOW (ref 38–126)
Anion gap: 11 (ref 5–15)
BUN: 8 mg/dL (ref 6–20)
CO2: 24 mmol/L (ref 22–32)
Calcium: 9.7 mg/dL (ref 8.9–10.3)
Chloride: 105 mmol/L (ref 98–111)
Creatinine, Ser: 0.84 mg/dL (ref 0.44–1.00)
GFR, Estimated: >60 mL/min (ref >60)
Glucose, Bld: 91 mg/dL (ref 70–99)
Potassium: 4.2 mmol/L (ref 3.5–5.1)
Sodium: 140 mmol/L (ref 135–145)
Total Bilirubin: 1 mg/dL (ref 0.0–1.2)
Total Protein: 6.7 g/dL (ref 6.5–8.1)

## 2023-06-17 NOTE — Anesthesia Preprocedure Evaluation (Signed)
 Anesthesia Evaluation  Patient identified by MRN, date of birth, ID band Patient awake    Reviewed: Allergy & Precautions, NPO status , Patient's Chart, lab work & pertinent test results  History of Anesthesia Complications Negative for: history of anesthetic complications  Airway Mallampati: II  TM Distance: >3 FB Neck ROM: Full    Dental  (+) Dental Advisory Given   Pulmonary neg shortness of breath, asthma (last needed inhaler last week, used it this morning in preparation for surgery) , neg sleep apnea, neg COPD, neg recent URI, Current Smoker and Patient abstained from smoking.   Pulmonary exam normal breath sounds clear to auscultation       Cardiovascular negative cardio ROS  Rhythm:Regular Rate:Normal     Neuro/Psych  Headaches, neg Seizures    GI/Hepatic Neg liver ROS,GERD  Medicated,,IBS   Endo/Other  negative endocrine ROS    Renal/GU negative Renal ROS     Musculoskeletal   Abdominal   Peds  Hematology negative hematology ROS (+) Lab Results      Component                Value               Date                      WBC                      7.6                 06/15/2023                HGB                      13.9                06/15/2023                HCT                      41.4                06/15/2023                MCV                      100.0               06/15/2023                PLT                      225                 06/15/2023              Anesthesia Other Findings   Reproductive/Obstetrics                             Anesthesia Physical Anesthesia Plan  ASA: 2  Anesthesia Plan: General   Post-op Pain Management: Tylenol  PO (pre-op)*   Induction: Intravenous  PONV Risk Score and Plan: 2 and Ondansetron , Dexamethasone  and Treatment may vary due to age or medical condition  Airway Management Planned: Oral ETT  Additional Equipment:    Intra-op Plan:   Post-operative Plan: Extubation in OR  Informed Consent: I have reviewed the patients History and Physical, chart, labs and discussed the procedure including the risks, benefits and alternatives for the proposed anesthesia with the patient or authorized representative who has indicated his/her understanding and acceptance.     Dental advisory given  Plan Discussed with: CRNA and Anesthesiologist  Anesthesia Plan Comments: (Risks of general anesthesia discussed including, but not limited to, sore throat, hoarse voice, chipped/damaged teeth, injury to vocal cords, nausea and vomiting, allergic reactions, lung infection, heart attack, stroke, and death. All questions answered. )       Anesthesia Quick Evaluation

## 2023-06-17 NOTE — H&P (Signed)
 Allison Kane is an 38 y.o. female G2_1102 with AUB for definitive management.  D/W pt r/b/a of DaVinci Robot Assisted Total Laparoscopic hysterectomy, Bilateral salpingectomy - possible cystoscopy.   D/W pt r/b/a of surgery including, but not limited to bleeding, infection, damage to surrounding organs and difficulty healin; als d/w pt process and expectations.  Question of synechaie on US , also h/o LTCS x 2.  Plan for d/c day of surgery.    Pertinent Gynecological History: H/o abn pap, LEEP - last 06/08/23 WNL HR HPV neg No STI G2P1102 5-6# LTCS x 2  Menstrual History:  Patient's last menstrual period was 06/10/2023 (exact date).    Past Medical History:  Diagnosis Date   GERD (gastroesophageal reflux disease)    History of cervical dysplasia 2011   s/p  leep   IBS (irritable bowel syndrome)    per pt intermittant   Irregular menstrual cycle    Migraines    Mild intermittent asthma    followed by pcp   (06-11-2023  pt stated last used rescue inhaler 2 weeks ago d/t allergies)   Seasonal allergic rhinitis     Past Surgical History:  Procedure Laterality Date   CERVICAL BIOPSY  W/ LOOP ELECTRODE EXCISION  2011   HGSIL   CESAREAN SECTION  2013   CESAREAN SECTION N/A 01/12/2014   Procedure: CESAREAN SECTION;  Surgeon: Margaretmary Shaver, MD;  Location: [facility];  Service: Obstetrics;  Laterality: N/A;   COMBINED REDUCTION MAMMAPLASTY W/ ABDOMINOPLASTY  2021   bilateral breast implants   DILATATION & CURETTAGE/HYSTEROSCOPY WITH MYOSURE N/A 08/14/2022   Procedure: DILATATION & CURETTAGE/HYSTEROSCOPY;  Surgeon: Margaretmary Shaver, MD;  Location: [facility];  Service: Gynecology;  Laterality: N/A;    Family History  Problem Relation Age of Onset   Cancer Maternal Aunt    Osteoporosis Maternal Grandmother    Arthritis Maternal Grandmother    Birth defects Maternal Grandmother    Cancer Maternal Grandmother    Hyperlipidemia Maternal Grandmother     Hyperlipidemia Mother    Diabetes Father    Hyperlipidemia Father    Hyperlipidemia Brother    Arthritis Paternal Grandmother    Asthma Paternal Grandmother    Cancer Paternal Grandmother        Colon, Metastatic   Hearing loss Paternal Grandmother    Hyperlipidemia Paternal Grandmother    Hypertension Paternal Grandmother     Social History:  reports that she has been smoking cigarettes. She has a 7.5 pack-year smoking history. She has never used smokeless tobacco. She reports current alcohol use. She reports that she does not use drugs. Married, self-employed  Allergies:  Allergies  Allergen Reactions   Peanut-Containing Drug Products Swelling    Allergic TO ALL NUTS  INCLUDING TREE NUTS    Wasp Venom Anaphylaxis and Swelling   Latex Swelling   Penicillins Other (See Comments)    Reaction:  High fever  Clindamycin  Meds:albuterol , OCP, Vit C & D  Review of Systems  Constitutional: Negative.   HENT: Negative.    Respiratory: Negative.    Gastrointestinal: Negative.   Genitourinary:  Positive for menstrual problem.  Musculoskeletal: Negative.   Skin: Negative.   Neurological: Negative.   Psychiatric/Behavioral: Negative.      Height 5\' 4"  (1.626 m), weight 59 kg, last menstrual period 06/10/2023. Physical Exam Constitutional:      Appearance: Normal appearance.  HENT:     Head: Normocephalic and atraumatic.  Cardiovascular:     Rate and Rhythm: Normal  rate and regular rhythm.  Pulmonary:     Effort: Pulmonary effort is normal.     Breath sounds: Normal breath sounds.  Abdominal:     General: Bowel sounds are normal.     Palpations: Abdomen is soft.  Genitourinary:    General: Normal vulva.     Rectum: Normal.  Musculoskeletal:        General: Normal range of motion.     Cervical back: Normal range of motion and neck supple.  Skin:    General: Skin is warm and dry.  Neurological:     General: No focal deficit present.     Mental Status: She is alert and  oriented to person, place, and time.  Psychiatric:        Mood and Affect: Mood normal.        Behavior: Behavior normal.     Cr 0.84, Hgb 13.9   Assessment/Plan: 37ypG2P1102 with abnormal uterine bleeding for RA TLH/BS, poss cysto D/w pt r/b/a of surgery Already prescribed postop meds Plan for d/c day of surgery  Bradi Arbuthnot Bovard-Stuckert 06/17/2023, 12:47 PM

## 2023-06-18 ENCOUNTER — Other Ambulatory Visit: Payer: Self-pay

## 2023-06-18 ENCOUNTER — Ambulatory Visit (HOSPITAL_BASED_OUTPATIENT_CLINIC_OR_DEPARTMENT_OTHER): Admitting: Anesthesiology

## 2023-06-18 ENCOUNTER — Ambulatory Visit (HOSPITAL_COMMUNITY)
Admission: RE | Admit: 2023-06-18 | Discharge: 2023-06-18 | Disposition: A | Discharge status: Home / Self Care | Attending: Obstetrics and Gynecology | Admitting: Obstetrics and Gynecology

## 2023-06-18 ENCOUNTER — Encounter (HOSPITAL_COMMUNITY): Payer: Self-pay | Admitting: Obstetrics and Gynecology

## 2023-06-18 ENCOUNTER — Encounter (HOSPITAL_COMMUNITY)
Admission: RE | Disposition: A | Discharge status: Home / Self Care | Payer: Self-pay | Source: Home / Self Care | Attending: Obstetrics and Gynecology

## 2023-06-18 ENCOUNTER — Ambulatory Visit (HOSPITAL_COMMUNITY): Admitting: Anesthesiology

## 2023-06-18 DIAGNOSIS — K219 Gastro-esophageal reflux disease without esophagitis: Secondary | ICD-10-CM | POA: Diagnosis not present

## 2023-06-18 DIAGNOSIS — F17210 Nicotine dependence, cigarettes, uncomplicated: Secondary | ICD-10-CM | POA: Insufficient documentation

## 2023-06-18 DIAGNOSIS — K589 Irritable bowel syndrome without diarrhea: Secondary | ICD-10-CM | POA: Insufficient documentation

## 2023-06-18 DIAGNOSIS — Z01818 Encounter for other preprocedural examination: Secondary | ICD-10-CM

## 2023-06-18 DIAGNOSIS — N939 Abnormal uterine and vaginal bleeding, unspecified: Secondary | ICD-10-CM | POA: Diagnosis not present

## 2023-06-18 DIAGNOSIS — N926 Irregular menstruation, unspecified: Secondary | ICD-10-CM | POA: Diagnosis present

## 2023-06-18 DIAGNOSIS — N72 Inflammatory disease of cervix uteri: Secondary | ICD-10-CM | POA: Diagnosis not present

## 2023-06-18 HISTORY — PX: HYSTERECTOMY, TOTAL, LAPAROSCOPIC, ROBOT-ASSISTED WITH SALPINGECTOMY: SHX7587

## 2023-06-18 HISTORY — PX: ROBOTIC ASSISTED LAPAROSCOPIC LYSIS OF ADHESION: SHX6080

## 2023-06-18 HISTORY — DX: Irregular menstruation, unspecified: N92.6

## 2023-06-18 LAB — CBC
HCT: 39.4 % (ref 36.0–46.0)
Hemoglobin: 13.3 g/dL (ref 12.0–15.0)
MCH: 33.4 pg (ref 26.0–34.0)
MCHC: 33.8 g/dL (ref 30.0–36.0)
MCV: 99 fL (ref 80.0–100.0)
Platelets: 201 10*3/uL (ref 150–400)
RBC: 3.98 MIL/uL (ref 3.87–5.11)
RDW: 12.8 % (ref 11.5–15.5)
WBC: 12.4 10*3/uL — ABNORMAL HIGH (ref 4.0–10.5)
nRBC: 0 % (ref 0.0–0.2)

## 2023-06-18 LAB — BASIC METABOLIC PANEL WITH GFR
Anion gap: 10 (ref 5–15)
BUN: 10 mg/dL (ref 6–20)
CO2: 24 mmol/L (ref 22–32)
Calcium: 8.8 mg/dL — ABNORMAL LOW (ref 8.9–10.3)
Chloride: 103 mmol/L (ref 98–111)
Creatinine, Ser: 0.87 mg/dL (ref 0.44–1.00)
GFR, Estimated: >60 mL/min (ref >60)
Glucose, Bld: 147 mg/dL — ABNORMAL HIGH (ref 70–99)
Potassium: 4.1 mmol/L (ref 3.5–5.1)
Sodium: 137 mmol/L (ref 135–145)

## 2023-06-18 LAB — POCT PREGNANCY, URINE: Preg Test, Ur: NEGATIVE

## 2023-06-18 SURGERY — HYSTERECTOMY, TOTAL, LAPAROSCOPIC, ROBOT-ASSISTED WITH SALPINGECTOMY
Anesthesia: General | Site: Abdomen

## 2023-06-18 MED ORDER — DEXAMETHASONE SODIUM PHOSPHATE 10 MG/ML IJ SOLN
INTRAMUSCULAR | Status: AC
  Filled 2023-06-18: qty 1

## 2023-06-18 MED ORDER — LACTATED RINGERS IV SOLN: INTRAVENOUS | Status: DC

## 2023-06-18 MED ORDER — SUGAMMADEX SODIUM 200 MG/2ML IV SOLN
INTRAVENOUS | Status: DC | PRN
  Administered 2023-06-18: 200 mg via INTRAVENOUS

## 2023-06-18 MED ORDER — PHENYLEPHRINE HCL-NACL 20-0.9 MG/250ML-% IV SOLN
INTRAVENOUS | Status: AC
  Filled 2023-06-18: qty 500

## 2023-06-18 MED ORDER — SIMETHICONE 80 MG PO CHEW: 80.0000 mg | CHEWABLE_TABLET | Freq: Four times a day (QID) | ORAL | Status: DC | PRN

## 2023-06-18 MED ORDER — DEXMEDETOMIDINE HCL IN NACL 80 MCG/20ML IV SOLN
INTRAVENOUS | Status: DC | PRN
  Administered 2023-06-18: 4 ug via INTRAVENOUS
  Administered 2023-06-18 (×2): 8 ug via INTRAVENOUS

## 2023-06-18 MED ORDER — ONDANSETRON HCL 4 MG/2ML IJ SOLN
INTRAMUSCULAR | Status: AC
  Filled 2023-06-18: qty 2

## 2023-06-18 MED ORDER — CHLORHEXIDINE GLUCONATE 0.12 % MT SOLN
OROMUCOSAL | Status: AC
  Filled 2023-06-18: qty 15

## 2023-06-18 MED ORDER — ROCURONIUM BROMIDE 10 MG/ML (PF) SYRINGE
PREFILLED_SYRINGE | INTRAVENOUS | Status: AC
  Filled 2023-06-18: qty 10

## 2023-06-18 MED ORDER — IBUPROFEN 800 MG PO TABS
800.0000 mg | ORAL_TABLET | Freq: Three times a day (TID) | ORAL | Status: DC | PRN
  Administered 2023-06-18: 800 mg via ORAL
  Filled 2023-06-18: qty 1

## 2023-06-18 MED ORDER — ACETAMINOPHEN 500 MG PO TABS
1000.0000 mg | ORAL_TABLET | ORAL | Status: AC
  Administered 2023-06-18: 1000 mg via ORAL

## 2023-06-18 MED ORDER — KETOROLAC TROMETHAMINE 30 MG/ML IJ SOLN
INTRAMUSCULAR | Status: DC | PRN
  Administered 2023-06-18: 30 mg via INTRAVENOUS

## 2023-06-18 MED ORDER — CEFAZOLIN SODIUM-DEXTROSE 2-4 GM/100ML-% IV SOLN
INTRAVENOUS | Status: AC
Start: 2023-06-18 — End: 2023-06-18
  Filled 2023-06-18: qty 100

## 2023-06-18 MED ORDER — ONDANSETRON HCL 4 MG PO TABS: 4.0000 mg | ORAL_TABLET | Freq: Four times a day (QID) | ORAL | Status: DC | PRN

## 2023-06-18 MED ORDER — GUAIFENESIN 100 MG/5ML PO LIQD: 15.0000 mL | ORAL | Status: DC | PRN

## 2023-06-18 MED ORDER — HYDROMORPHONE HCL 1 MG/ML IJ SOLN
INTRAMUSCULAR | Status: AC
Start: 2023-06-18 — End: 2023-06-18
  Administered 2023-06-18: 0.5 mg via INTRAVENOUS
  Filled 2023-06-18: qty 1

## 2023-06-18 MED ORDER — PANTOPRAZOLE SODIUM 20 MG PO TBEC
20.0000 mg | DELAYED_RELEASE_TABLET | Freq: Every day | ORAL | Status: DC
Start: 2023-06-18 — End: 2023-06-18
  Administered 2023-06-18: 20 mg via ORAL
  Filled 2023-06-18: qty 1

## 2023-06-18 MED ORDER — LIDOCAINE 2% (20 MG/ML) 5 ML SYRINGE
INTRAMUSCULAR | Status: AC
  Filled 2023-06-18: qty 5

## 2023-06-18 MED ORDER — CEFAZOLIN SODIUM-DEXTROSE 2-4 GM/100ML-% IV SOLN
2.0000 g | INTRAVENOUS | Status: AC
  Administered 2023-06-18: 2 g via INTRAVENOUS

## 2023-06-18 MED ORDER — BUPIVACAINE HCL (PF) 0.25 % IJ SOLN
INTRAMUSCULAR | Status: DC | PRN
  Administered 2023-06-18: 15 mL

## 2023-06-18 MED ORDER — ORAL CARE MOUTH RINSE: 15.0000 mL | Freq: Once | OROMUCOSAL | Status: AC

## 2023-06-18 MED ORDER — PROPOFOL 1000 MG/100ML IV EMUL
INTRAVENOUS | Status: AC
  Filled 2023-06-18: qty 100

## 2023-06-18 MED ORDER — ALUM & MAG HYDROXIDE-SIMETH 200-200-20 MG/5ML PO SUSP: 30.0000 mL | ORAL | Status: DC | PRN

## 2023-06-18 MED ORDER — 0.9 % SODIUM CHLORIDE (POUR BTL) OPTIME
TOPICAL | Status: DC | PRN
  Administered 2023-06-18: 1000 mL

## 2023-06-18 MED ORDER — PROPOFOL 500 MG/50ML IV EMUL
INTRAVENOUS | Status: DC | PRN
  Administered 2023-06-18: 150 ug/kg/min via INTRAVENOUS

## 2023-06-18 MED ORDER — ALBUTEROL SULFATE (2.5 MG/3ML) 0.083% IN NEBU: 3.0000 mL | INHALATION_SOLUTION | Freq: Four times a day (QID) | RESPIRATORY_TRACT | Status: DC | PRN

## 2023-06-18 MED ORDER — PROPOFOL 10 MG/ML IV BOLUS
INTRAVENOUS | Status: DC | PRN
  Administered 2023-06-18: 150 mg via INTRAVENOUS

## 2023-06-18 MED ORDER — BUPIVACAINE HCL (PF) 0.25 % IJ SOLN
INTRAMUSCULAR | Status: AC
  Filled 2023-06-18: qty 30

## 2023-06-18 MED ORDER — HYDROMORPHONE HCL 1 MG/ML IJ SOLN: 0.2500 mg | INTRAMUSCULAR | Status: DC | PRN

## 2023-06-18 MED ORDER — GABAPENTIN 300 MG PO CAPS
300.0000 mg | ORAL_CAPSULE | Freq: Once | ORAL | Status: AC
  Administered 2023-06-18: 300 mg via ORAL

## 2023-06-18 MED ORDER — SODIUM CHLORIDE 0.9 % IR SOLN
Status: DC | PRN
  Administered 2023-06-18: 1000 mL

## 2023-06-18 MED ORDER — LIDOCAINE HCL (CARDIAC) PF 100 MG/5ML IV SOSY
PREFILLED_SYRINGE | INTRAVENOUS | Status: DC | PRN
  Administered 2023-06-18: 40 mg via INTRAVENOUS
  Administered 2023-06-18: 60 mg via INTRAVENOUS

## 2023-06-18 MED ORDER — PROPOFOL 10 MG/ML IV BOLUS
INTRAVENOUS | Status: AC
  Filled 2023-06-18: qty 20

## 2023-06-18 MED ORDER — FENTANYL CITRATE (PF) 250 MCG/5ML IJ SOLN
INTRAMUSCULAR | Status: AC
  Filled 2023-06-18: qty 5

## 2023-06-18 MED ORDER — MENTHOL 3 MG MT LOZG: 1.0000 | LOZENGE | OROMUCOSAL | Status: DC | PRN

## 2023-06-18 MED ORDER — OXYCODONE HCL 5 MG PO TABS: 5.0000 mg | ORAL_TABLET | Freq: Once | ORAL | Status: DC | PRN

## 2023-06-18 MED ORDER — OXYCODONE-ACETAMINOPHEN 5-325 MG PO TABS
1.0000 | ORAL_TABLET | ORAL | Status: DC | PRN
  Administered 2023-06-18: 1 via ORAL
  Filled 2023-06-18: qty 1

## 2023-06-18 MED ORDER — DEXAMETHASONE SODIUM PHOSPHATE 10 MG/ML IJ SOLN
INTRAMUSCULAR | Status: DC | PRN
  Administered 2023-06-18: 10 mg via INTRAVENOUS

## 2023-06-18 MED ORDER — AMISULPRIDE (ANTIEMETIC) 5 MG/2ML IV SOLN: 10.0000 mg | Freq: Once | INTRAVENOUS | Status: DC | PRN

## 2023-06-18 MED ORDER — OXYCODONE HCL 5 MG/5ML PO SOLN: 5.0000 mg | Freq: Once | ORAL | Status: DC | PRN

## 2023-06-18 MED ORDER — ACETAMINOPHEN 500 MG PO TABS
ORAL_TABLET | ORAL | Status: AC
  Filled 2023-06-18: qty 2

## 2023-06-18 MED ORDER — MIDAZOLAM HCL 2 MG/2ML IJ SOLN
INTRAMUSCULAR | Status: AC
  Filled 2023-06-18: qty 2

## 2023-06-18 MED ORDER — CHLORHEXIDINE GLUCONATE 0.12 % MT SOLN
15.0000 mL | Freq: Once | OROMUCOSAL | Status: AC
  Administered 2023-06-18: 15 mL via OROMUCOSAL

## 2023-06-18 MED ORDER — ONDANSETRON HCL 4 MG/2ML IJ SOLN: 4.0000 mg | Freq: Four times a day (QID) | INTRAMUSCULAR | Status: DC | PRN

## 2023-06-18 MED ORDER — METHYLENE BLUE (ANTIDOTE) 1 % IV SOLN
INTRAVENOUS | Status: AC
  Filled 2023-06-18: qty 10

## 2023-06-18 MED ORDER — HYDROMORPHONE HCL 1 MG/ML IJ SOLN
0.2000 mg | INTRAMUSCULAR | Status: DC | PRN
  Administered 2023-06-18: 0.6 mg via INTRAVENOUS
  Filled 2023-06-18: qty 1

## 2023-06-18 MED ORDER — FENTANYL CITRATE (PF) 100 MCG/2ML IJ SOLN
INTRAMUSCULAR | Status: DC | PRN
  Administered 2023-06-18: 50 ug via INTRAVENOUS
  Administered 2023-06-18: 100 ug via INTRAVENOUS

## 2023-06-18 MED ORDER — ROCURONIUM BROMIDE 100 MG/10ML IV SOLN
INTRAVENOUS | Status: DC | PRN
  Administered 2023-06-18: 20 mg via INTRAVENOUS
  Administered 2023-06-18: 50 mg via INTRAVENOUS
  Administered 2023-06-18: 10 mg via INTRAVENOUS

## 2023-06-18 MED ORDER — GABAPENTIN 300 MG PO CAPS
ORAL_CAPSULE | ORAL | Status: AC
  Filled 2023-06-18: qty 1

## 2023-06-18 MED ORDER — ONDANSETRON HCL 4 MG/2ML IJ SOLN
INTRAMUSCULAR | Status: DC | PRN
  Administered 2023-06-18: 4 mg via INTRAVENOUS

## 2023-06-18 MED ORDER — POVIDONE-IODINE 10 % EX SWAB
2.0000 | Freq: Once | CUTANEOUS | Status: AC
  Administered 2023-06-18: 2 via TOPICAL

## 2023-06-18 MED ORDER — MIDAZOLAM HCL 5 MG/5ML IJ SOLN
INTRAMUSCULAR | Status: DC | PRN
Start: 2023-06-18 — End: 2023-06-18
  Administered 2023-06-18: 2 mg via INTRAVENOUS

## 2023-06-18 SURGICAL SUPPLY — 61 items
APPLICATOR ARISTA FLEXITIP XL (MISCELLANEOUS) IMPLANT
CANNULA CAP OBTURATR AIRSEAL 8 (CAP) ×3 IMPLANT
CANNULA REDUCER 12-8 DVNC XI (CANNULA) IMPLANT
COVER BACK TABLE 60X90IN (DRAPES) ×3 IMPLANT
COVER TIP SHEARS 8 DVNC (MISCELLANEOUS) ×3 IMPLANT
DEFOGGER SCOPE WARM SEASHARP (MISCELLANEOUS) ×3 IMPLANT
DERMABOND ADVANCED .7 DNX12 (GAUZE/BANDAGES/DRESSINGS) ×3 IMPLANT
DERMABOND ADVANCED .7 DNX6 (GAUZE/BANDAGES/DRESSINGS) ×1 IMPLANT
DRAPE ARM DVNC X/XI (DISPOSABLE) ×12 IMPLANT
DRAPE COLUMN DVNC XI (DISPOSABLE) ×3 IMPLANT
DRAPE SURG IRRIG POUCH 19X23 (DRAPES) ×6 IMPLANT
DRAPE UTILITY XL STRL (DRAPES) ×3 IMPLANT
DRIVER NDL MEGA SUTCUT DVNCXI (INSTRUMENTS) ×2 IMPLANT
DRIVER NDLE MEGA SUTCUT DVNCXI (INSTRUMENTS) ×3 IMPLANT
DURAPREP 26ML APPLICATOR (WOUND CARE) ×3 IMPLANT
ELECTRODE REM PT RTRN 9FT ADLT (ELECTROSURGICAL) ×3 IMPLANT
FORCEPS BPLR FENES DVNC XI (FORCEP) ×3 IMPLANT
FORCEPS PROGRASP DVNC XI (FORCEP) IMPLANT
GAUZE 4X4 16PLY ~~LOC~~+RFID DBL (SPONGE) IMPLANT
GLOVE BIO SURGEON STRL SZ 6.5 (GLOVE) ×6 IMPLANT
GLOVE BIOGEL PI IND STRL 6 (GLOVE) ×1 IMPLANT
GLOVE BIOGEL PI IND STRL 7.0 (GLOVE) ×1 IMPLANT
GLOVE SURG SS PI 6.0 STRL IVOR (GLOVE) ×1 IMPLANT
GLOVE SURG SS PI 6.5 STRL IVOR (GLOVE) ×3 IMPLANT
HEMOSTAT ARISTA ABSORB 3G PWDR (HEMOSTASIS) IMPLANT
HOLDER FOLEY CATH W/STRAP (MISCELLANEOUS) IMPLANT
IRRIGATION SUCT STRKRFLW 2 WTP (MISCELLANEOUS) ×3 IMPLANT
KIT PINK PAD W/HEAD ARE REST (MISCELLANEOUS) ×3 IMPLANT
KIT PINK PAD W/HEAD ARM REST (MISCELLANEOUS) ×2 IMPLANT
KIT TURNOVER KIT B (KITS) ×3 IMPLANT
LEGGING LITHOTOMY PAIR STRL (DRAPES) ×3 IMPLANT
MANIFOLD NEPTUNE II (INSTRUMENTS) ×3 IMPLANT
MANIPULATOR ADVINCU DEL 2.5 PL (MISCELLANEOUS) IMPLANT
MANIPULATOR ADVINCU DEL 3.0 PL (MISCELLANEOUS) ×1 IMPLANT
MANIPULATOR ADVINCU DEL 3.5 PL (MISCELLANEOUS) IMPLANT
MANIPULATOR ADVINCU DEL 4.0 PL (MISCELLANEOUS) IMPLANT
NDL INSUFFLATION 14GA 120MM (NEEDLE) ×2 IMPLANT
NEEDLE INSUFFLATION 14GA 120MM (NEEDLE) ×3 IMPLANT
OBTURATOR OPTICALSTD 8 DVNC (TROCAR) ×3 IMPLANT
OCCLUDER COLPOPNEUMO (BALLOONS) IMPLANT
PACK ROBOT WH (CUSTOM PROCEDURE TRAY) ×3 IMPLANT
PACK ROBOTIC GOWN (GOWN DISPOSABLE) ×3 IMPLANT
PAD OB MATERNITY 11 LF (PERSONAL CARE ITEMS) ×3 IMPLANT
SAVER CELL AAL HAEMONETICS (INSTRUMENTS) IMPLANT
SCISSORS MNPLR CVD DVNC XI (INSTRUMENTS) ×3 IMPLANT
SEAL UNIV 5-12 XI (MISCELLANEOUS) ×6 IMPLANT
SEALER VESSEL EXT DVNC XI (MISCELLANEOUS) ×1 IMPLANT
SET IRRIG Y TYPE TUR BLADDER L (SET/KITS/TRAYS/PACK) IMPLANT
SET TUBE FILTERED XL AIRSEAL (SET/KITS/TRAYS/PACK) ×3 IMPLANT
SLEEVE SCD COMPRESS KNEE MED (STOCKING) ×3 IMPLANT
SPIKE FLUID TRANSFER (MISCELLANEOUS) ×6 IMPLANT
SUT VIC AB 0 CT1 27XBRD ANBCTR (SUTURE) IMPLANT
SUT VIC AB 4-0 PS2 18 (SUTURE) ×3 IMPLANT
SUT VIC AB 4-0 PS2 27 (SUTURE) ×2 IMPLANT
SUT VICRYL 0 UR6 27IN ABS (SUTURE) IMPLANT
SUT VLOC 180 0 9IN GS21 (SUTURE) ×3 IMPLANT
TOWEL GREEN STERILE (TOWEL DISPOSABLE) ×3 IMPLANT
TRAY FOL W/BAG SLVR 16FR STRL (SET/KITS/TRAYS/PACK) ×1 IMPLANT
TROCAR PORT AIRSEAL 8X100 (TROCAR) IMPLANT
UNDERPAD 30X36 HEAVY ABSORB (UNDERPADS AND DIAPERS) ×3 IMPLANT
WATER STERILE IRR 1000ML POUR (IV SOLUTION) ×3 IMPLANT

## 2023-06-18 NOTE — Transfer of Care (Signed)
 Immediate Anesthesia Transfer of Care Note  Patient: Allison Kane  Procedure(s) Performed: HYSTERECTOMY, TOTAL, LAPAROSCOPIC, ROBOT-ASSISTED WITH SALPINGECTOMY (Bilateral) LYSIS, ADHESIONS AND OMENTAL TO ABDOMINAL WALL, ROBOT-ASSISTED, LAPAROSCOPIC  Patient Location: PACU  Anesthesia Type:General  Level of Consciousness: drowsy and patient cooperative  Airway & Oxygen Therapy: Patient Spontanous Breathing and Patient connected to face mask oxygen  Post-op Assessment: Report given to RN, Post -op Vital signs reviewed and stable, and Patient moving all extremities X 4  Post vital signs: Reviewed and stable  Last Vitals:  Vitals Value Taken Time  BP 104/78 06/18/23 0917  Temp    Pulse 69 06/18/23 0921  Resp 19 06/18/23 0921  SpO2 100 % 06/18/23 0921  Vitals shown include unfiled device data.  Last Pain:  Vitals:   06/18/23 0611  TempSrc: Oral  PainSc: 2       Patients Stated Pain Goal: 6 (06/18/23 1308)  Complications: No notable events documented.

## 2023-06-18 NOTE — Interval H&P Note (Signed)
 History and Physical Interval Note:  06/18/2023 7:04 AM  Allison Kane  has presented today for surgery, with the diagnosis of irregular periords.  The various methods of treatment have been discussed with the patient and family. After consideration of risks, benefits and other options for treatment, the patient has consented to  Procedure(s): HYSTERECTOMY, TOTAL, LAPAROSCOPIC, ROBOT-ASSISTED WITH SALPINGECTOMY (Bilateral) CYSTOSCOPY (Bilateral) as a surgical intervention.  The patient's history has been reviewed, patient examined, no change in status, stable for surgery.  I have reviewed the patient's chart and labs.  Questions were answered to the patient's satisfaction.     [name]

## 2023-06-18 NOTE — Op Note (Unsigned)
 NAME: Allison Kane, Allison Kane MEDICAL RECORD NO: [id number] ACCOUNT NO: [id number] DATE OF BIRTH: [birth year] FACILITY: [facility] LOCATION: [facility] PHYSICIAN: Margaretmary Shaver, MD  Operative Report   DATE OF PROCEDURE: 06/18/2023  PREOPERATIVE DIAGNOSES: 1.  Abnormal uterine bleeding. 2.  Irregular periods.  POSTOPERATIVE DIAGNOSES: 1.  Abnormal uterine bleeding. 2.  Irregular periods. 3.  Status post hysterectomy.  PROCEDURES PERFORMED:  Da Vinci robot-assisted total laparoscopic hysterectomy with bilateral salpingectomy, also lysis of adhesions omental to anterior abdominal wall.  SURGEON:  Margaretmary Shaver, MD.  ASSISTANTCuster Downs, RNFA.  ANESTHESIA:  Local and general.  ESTIMATED BLOOD LOSS:  50 mL.  IV FLUIDS:  Per Anesthesia record.  URINE OUTPUT:  Per Anesthesia record.  COMPLICATIONS:  None.  PATHOLOGY:  Uterus, cervix, and bilateral fallopian tubes.  DESCRIPTION OF PROCEDURE:  After informed consent was reviewed with the patient including risks, benefits, and alternatives of the surgical procedure, the patient was transported to the operating room and placed on the table in the supine position.   General anesthesia was induced and found to be adequate.  The patient was then prepped and draped in the normal sterile fashion.  After an appropriate timeout was performed, an Advincula retractor was placed into the patient's uterus.  A Foley catheter  was also placed.  The gloves and gowns were changed.  Attention was turned to the abdominal portion of the case.  An approximately 8 mm supraumbilical incision was made.  Using a Veress needle, pneumoperitoneum was obtained after passing a hanging drop  test with an opening pressure of 2 mmHg.  As the patient had previously had abdominoplasty, her abdominal wall was very tight.  The umbilical trocar was placed under direct visualization.  Accessory ports were placed in both the right and left under  direct  visualization as well.  Survey revealed normal-appearing uterus, normal bilateral tubes and ovaries.  The fallopian tube on the left was grasped at the fimbriated end and using a vessel sealer, it was sized to the level of the cornea.  The  uteroovarian ligament was ligated.  The round ligament was ligated.  The cardinal ligaments were ligated to the level of the uterine artery.  A bladder flap was created to the midline.  The uterine artery was doubly ligated.  Attention was then turned to  the right, which in a similar fashion, the right tube was elevated and grasped with the fimbriated end, excised to the level of the cornea.  The right uteroovarian ligament and round ligament were ligated.  The cardinal ligaments were ligated to the  level of the uterine artery.  The uterine artery was doubly ligated.  The bladder flap was created to meet up with the bladder flap from the other side.  The instruments were exchanged for scissors and the cystotomy was performed.  The uterus was  delivered.  The cuff was irrigated and closed with 0 barbed suture.  Hemostasis was assured.  The trocars were removed under direct visualization.  The trocar sites were closed with suture and Dermabond.  The patient tolerated the procedure well.  The  Foley catheter was removed.  The patient was awakened in stable condition and transported to the PACU.    MUK D: 06/18/2023 9:03:36 am T: 06/18/2023 9:52:00 am  JOB: [id number]/ [id number]

## 2023-06-18 NOTE — Anesthesia Postprocedure Evaluation (Signed)
 Anesthesia Post Note  Patient: Allison Kane  Procedure(s) Performed: HYSTERECTOMY, TOTAL, LAPAROSCOPIC, ROBOT-ASSISTED WITH SALPINGECTOMY (Bilateral) LYSIS, ADHESIONS AND OMENTAL TO ABDOMINAL WALL, ROBOT-ASSISTED, LAPAROSCOPIC     Patient location during evaluation: PACU Anesthesia Type: General Level of consciousness: awake Pain management: pain level controlled Vital Signs Assessment: post-procedure vital signs reviewed and stable Respiratory status: spontaneous breathing, nonlabored ventilation and respiratory function stable Cardiovascular status: blood pressure returned to baseline and stable Postop Assessment: no apparent nausea or vomiting Anesthetic complications: no   No notable events documented.  Last Vitals:  Vitals:   06/18/23 1007 06/18/23 1030  BP:  95/60  Pulse: 71 (!) 52  Resp:  16  Temp:  36.6 C  SpO2: 93% 97%    Last Pain:  Vitals:   06/18/23 1030  TempSrc: Oral  PainSc:                  Conard Decent

## 2023-06-18 NOTE — Progress Notes (Signed)
*   Day of Surgery * Procedure(s) (LRB): HYSTERECTOMY, TOTAL, LAPAROSCOPIC, ROBOT-ASSISTED WITH SALPINGECTOMY (Bilateral) LYSIS, ADHESIONS AND OMENTAL TO ABDOMINAL WALL, ROBOT-ASSISTED, LAPAROSCOPIC (N/A)  Subjective: Patient reports incisional pain, tolerating PO, and no problems voiding.    Objective: I have reviewed patient's vital signs, intake and output, and medications.  General: alert and no distress Resp: clear to auscultation bilaterally Cardio: regular rate and rhythm GI: soft, non-tender; bowel sounds normal; no masses,  no organomegaly Extremities: extremities normal, atraumatic, no cyanosis or edema Inc C/D/I  Assessment: s/p Procedure(s): HYSTERECTOMY, TOTAL, LAPAROSCOPIC, ROBOT-ASSISTED WITH SALPINGECTOMY (Bilateral) LYSIS, ADHESIONS AND OMENTAL TO ABDOMINAL WALL, ROBOT-ASSISTED, LAPAROSCOPIC (N/A): stable and progressing well  Plan: Encourage ambulation Advance to PO medication Discharge home when ambulating, voiding, tolerating po and pain controlled Has postop prescriptions at home Has f/u scheduled  LOS: 0 days    Margaretmary Shaver, MD 06/18/2023, 12:54 PM

## 2023-06-18 NOTE — Brief Op Note (Signed)
 06/18/2023  8:57 AM  PATIENT:  Zebedee Hibbs  38 y.o. female  PRE-OPERATIVE DIAGNOSIS:  irregular periords  POST-OPERATIVE DIAGNOSIS:  irregular periords  PROCEDURE:  Procedure(s): HYSTERECTOMY, TOTAL, LAPAROSCOPIC, ROBOT-ASSISTED WITH SALPINGECTOMY (Bilateral) LYSIS, ADHESIONS AND OMENTAL TO ABDOMINAL WALL, ROBOT-ASSISTED, LAPAROSCOPIC  SURGEON:  Surgeons and Role:    * Bovard-Stuckert, Lerae Langham, MD - Primary  ASSISTANTS: Estrella Hench RNFA   ANESTHESIA:   local and general  EBL:  50 mL IVF and uop per anesthesia  BLOOD ADMINISTERED:none  DRAINS: none   LOCAL MEDICATIONS USED:  MARCAINE      SPECIMEN:  Source of Specimen:  uterus, cervix, B fallopian tubes  DISPOSITION OF SPECIMEN:  PATHOLOGY  COUNTS:  YES  TOURNIQUET:  * No tourniquets in log *  DICTATION: .Other Dictation: Dictation Number [id number]  PLAN OF CARE:  extended PACU  PATIENT DISPOSITION:  PACU - hemodynamically stable.   Delay start of Pharmacological VTE agent (>24hrs) due to surgical blood loss or risk of bleeding: not applicable

## 2023-06-18 NOTE — Anesthesia Procedure Notes (Signed)
 Procedure Name: Intubation Date/Time: 06/18/2023 7:32 AM  Performed by: Ancel Baltimore, RNPre-anesthesia Checklist: Patient identified, Emergency Drugs available, Suction available and Patient being monitored Patient Re-evaluated:Patient Re-evaluated prior to induction Oxygen Delivery Method: Circle System Utilized Preoxygenation: Pre-oxygenation with 100% oxygen Induction Type: IV induction Ventilation: Mask ventilation without difficulty Laryngoscope Size: Mac and 3 Grade View: Grade I Tube type: Oral Tube size: 7.0 mm Number of attempts: 1 Airway Equipment and Method: Stylet Placement Confirmation: ETT inserted through vocal cords under direct vision, positive ETCO2 and breath sounds checked- equal and bilateral Secured at: 23 cm Tube secured with: Tape Dental Injury: Teeth and Oropharynx as per pre-operative assessment  Comments: Preformed by SRNA, MDA and CRNA present

## 2023-06-19 ENCOUNTER — Encounter (HOSPITAL_COMMUNITY): Payer: Self-pay | Admitting: Obstetrics and Gynecology

## 2023-06-19 LAB — SURGICAL PATHOLOGY

## 2023-08-27 ENCOUNTER — Telehealth: Payer: Self-pay | Admitting: Physical Medicine and Rehabilitation

## 2023-08-27 NOTE — Telephone Encounter (Signed)
 Patient called and wants to get seen about her lower back. CB#[phone]

## 2023-08-28 ENCOUNTER — Telehealth: Payer: Self-pay | Admitting: Physical Medicine and Rehabilitation

## 2023-08-28 NOTE — Telephone Encounter (Signed)
 Patient returning a call to set up an appointment

## 2023-09-04 ENCOUNTER — Ambulatory Visit (INDEPENDENT_AMBULATORY_CARE_PROVIDER_SITE_OTHER): Admitting: Physical Medicine and Rehabilitation

## 2023-09-04 ENCOUNTER — Encounter: Payer: Self-pay | Admitting: Physical Medicine and Rehabilitation

## 2023-09-04 DIAGNOSIS — M5442 Lumbago with sciatica, left side: Secondary | ICD-10-CM

## 2023-09-04 DIAGNOSIS — G8929 Other chronic pain: Secondary | ICD-10-CM

## 2023-09-04 DIAGNOSIS — M5441 Lumbago with sciatica, right side: Secondary | ICD-10-CM

## 2023-09-04 DIAGNOSIS — M546 Pain in thoracic spine: Secondary | ICD-10-CM

## 2023-09-04 DIAGNOSIS — M7918 Myalgia, other site: Secondary | ICD-10-CM | POA: Diagnosis not present

## 2023-09-04 DIAGNOSIS — M5416 Radiculopathy, lumbar region: Secondary | ICD-10-CM

## 2023-09-04 NOTE — Progress Notes (Signed)
 Pain Scale   Average Pain 4 Patient advising she has chronic middle back pain radiating to lower back. Patient advising she had a CT scan done and it shows she has a FX on T7 & T9...        +Driver, -BT, -Dye Allergies.

## 2023-09-04 NOTE — Progress Notes (Signed)
 Allison Kane - 38 y.o. female MRN [id number]  Date of birth: 10/31/1985  Office Visit Note: Visit Date: 09/04/2023 PCP: Nena Rosina LITTIE, PA-C Referred by: Nena Rosina LITTIE, PA-C  Subjective: Chief Complaint  Patient presents with   Middle Back - Pain   HPI: Allison Kane is a 38 y.o. female who comes in today for evaluation of chronic, worsening and severe bilateral lower back pain, intermittent radiating down posterior legs. Pain ongoing for several years, worsens with prolonged sitting and standing. She describes pain as throbbing and sore sensation, currently rates as 7 out of 10. Some relief of pain with home exercise regimen, rest and use of medications. History of formal physical therapy and chiropractic treatments with short term relief of pain. Lumbar MRI imaging from 2019 shows broad-based central and eccentric to the right protrusion at L5-S1 encroaching on the right S1 nerve in the subarticular recesses, slightly indenting the ventral thecal sac. No high grade spinal canal stenosis noted. She underwent right L5-S1 interlaminar epidural steroid injection in our office on 09/12/2021. She reports 50% relief of pain for about 2 weeks. Patient denies focal weakness, numbness and tingling. No recent trauma or falls. 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   Also reports chronic intermittent bilateral thoracic back pain. Pain ongoing for several years, worsens with activity. She describes her pain as sore and aching sensation, currently rates 7 out of 10. Recent CT of chest from [facility] shows small remote T7 and T9 compression fractures. She is concerned these compression fractures are causing her middle back pain. She is scheduled for DEXA scan this upcoming Thursday.      Review of Systems  Musculoskeletal:  Positive for back pain and myalgias.  Neurological:  Negative for tingling, sensory change, focal  weakness and weakness.  All other systems reviewed and are negative.  Otherwise per HPI.  Assessment & Plan: Visit Diagnoses:    ICD-10-CM   1. Chronic bilateral low back pain with bilateral sciatica  M54.42 MR LUMBAR SPINE WO CONTRAST   M54.41    G89.29     2. Lumbar radiculopathy  M54.16 MR LUMBAR SPINE WO CONTRAST    3. Chronic bilateral thoracic back pain  M54.6 MR LUMBAR SPINE WO CONTRAST   G89.29     4. Myofascial pain syndrome  M79.18        Plan: Findings:  1. Chronic, worsening and severe bilateral lower back pain, intermittent radiation of pain down both legs. She continues to have severe pain despite good conservative therapies such as formal physical therapy, chiropractic treatments, home exercise regimen, rest and use of medications. Patients clinical presentation and exam are consistent with lumbar radiculopathy. Lumbar MRI imaging from 2019 shows central to right disc protrusion at L5-S1. I do feel this disc herniation has healed, however I placed new order for lumbar MRI imaging given the chronicity of her symptoms and continued pain post injection. I will have her follow up for lumbar MRI review and to discuss further treatment options. Could also look at medication management such as Meloxicam  and Lyrica.   2. Chronic bilateral thoracic back pain. She continues to have severe pain despite good conservative therapies such as home exercise regimen, rest and use of medications. Patients clinical presentation and exam are consistent with myofascial pain syndrome. There is myofascial tenderness to bilateral thoracic paraspinal regions upon palpation today. Recent CT of chest shows small remote T7 and T9 compression fractures.  I explained to patient that these compression fractures are likely old and not a source of her pain. I did place order for short course of formal physical therapy to address chronic myofascial pain. No red flag symptoms noted upon exam today.     Meds &  Orders: No orders of the defined types were placed in this encounter.   Orders Placed This Encounter  Procedures   MR LUMBAR SPINE WO CONTRAST    Follow-up: Return for Lumbar MRI review.   Procedures: No procedures performed      Clinical History: Narrative & Impression CLINICAL DATA:  Low back pain radiating into the buttocks and legs for approximately 2 years, worsening.   EXAM: MRI LUMBAR SPINE WITHOUT CONTRAST   TECHNIQUE: Multiplanar, multisequence MR imaging of the lumbar spine was performed. No intravenous contrast was administered.   COMPARISON:  Plain films lumbar spine 05/04/2017.   FINDINGS: Segmentation:  Standard.   Alignment:  Mild convex right scoliosis noted.  No listhesis.   Vertebrae: Height and signal are normal. No pars interarticularis defect.   Conus medullaris and cauda equina: Conus extends to the L1-2 level. Conus and cauda equina appear normal.   Paraspinal and other soft tissues: Negative.   Disc levels:   The T11-12 to L4-5 levels are negative.   L5-S1: There is a broad-based central and right side disc protrusion. The disc encroaches on the right S1 root and slightly indents the ventral thecal sac. The foramina are open.   IMPRESSION: Broad-based central and eccentric to the right protrusion at L5-S1 encroaches on the right S1 in the subarticular recesses slightly indents the ventral thecal sac.   Mild convex right scoliosis.     Electronically Signed   By: Debby Prader M.D.   On: 06/02/2017 09:19   She reports that she has been smoking cigarettes. She has a 7.5 pack-year smoking history. She has never used smokeless tobacco. No results for input(s): HGBA1C, LABURIC in the last 8760 hours.  Objective:  VS:  HT:    WT:   BMI:     BP:   HR: bpm  TEMP: ( )  RESP:  Physical Exam Vitals and nursing note reviewed.  HENT:     Head: Normocephalic and atraumatic.     Right Ear: External ear normal.     Left Ear:  External ear normal.     Nose: Nose normal.     Mouth/Throat:     Mouth: Mucous membranes are moist.  Eyes:     Extraocular Movements: Extraocular movements intact.  Cardiovascular:     Rate and Rhythm: Normal rate.     Pulses: Normal pulses.  Pulmonary:     Effort: Pulmonary effort is normal.  Abdominal:     General: Abdomen is flat. There is no distension.  Musculoskeletal:        General: Tenderness present.     Cervical back: Normal range of motion.     Comments: Patient rises from seated position to standing without difficulty. Good lumbar range of motion. No pain noted with facet loading. 5/5 strength noted with bilateral hip flexion, knee flexion/extension, ankle dorsiflexion/plantarflexion and EHL. No clonus noted bilaterally. No pain upon palpation of greater trochanters. No pain with internal/external rotation of bilateral hips. Sensation intact bilaterally. Myofascial tenderness noted to bilateral thoracic paraspinal regions upon palpation. Negative slump test bilaterally. Ambulates without aid, gait steady.     Skin:    General: Skin is warm and dry.  Capillary Refill: Capillary refill takes less than 2 seconds.  Neurological:     General: No focal deficit present.     Mental Status: She is alert and oriented to person, place, and time.  Psychiatric:        Mood and Affect: Mood normal.        Behavior: Behavior normal.     Ortho Exam  Imaging: No results found.  Past Medical/Family/Surgical/Social History: Medications & Allergies reviewed per EMR, new medications updated. Patient Active Problem List   Diagnosis Date Noted   Abnormal uterine bleeding (AUB) 08/14/2022   Partial uterine septum 08/14/2022   Chronic low back pain 12/10/2017   Ingrown toenail 06/26/2017   History of abnormal cervical Papanicolaou smear 05/04/2017   Nasal septal deviation 05/04/2015   Asthma 03/23/2015   GERD without esophagitis 03/23/2015   IBS (irritable bowel syndrome)  03/23/2015   S/P cesarean section 01/12/2014   History of abnormal Pap smear 09/09/2012   Past Medical History:  Diagnosis Date   GERD (gastroesophageal reflux disease)    History of cervical dysplasia 2011   s/p  leep   IBS (irritable bowel syndrome)    per pt intermittant   Irregular menstrual cycle    Migraines    Mild intermittent asthma    followed by pcp   (06-11-2023  pt stated last used rescue inhaler 2 weeks ago d/t allergies)   Seasonal allergic rhinitis    Family History  Problem Relation Age of Onset   Cancer Maternal Aunt    Osteoporosis Maternal Grandmother    Arthritis Maternal Grandmother    Birth defects Maternal Grandmother    Cancer Maternal Grandmother    Hyperlipidemia Maternal Grandmother    Hyperlipidemia Mother    Diabetes Father    Hyperlipidemia Father    Hyperlipidemia Brother    Arthritis Paternal Grandmother    Asthma Paternal Grandmother    Cancer Paternal Grandmother        Colon, Metastatic   Hearing loss Paternal Grandmother    Hyperlipidemia Paternal Grandmother    Hypertension Paternal Grandmother    Past Surgical History:  Procedure Laterality Date   CERVICAL BIOPSY  W/ LOOP ELECTRODE EXCISION  2011   HGSIL   CESAREAN SECTION  2013   CESAREAN SECTION N/A 01/12/2014   Procedure: CESAREAN SECTION;  Surgeon: Ezzie Buba, MD;  Location: [facility];  Service: Obstetrics;  Laterality: N/A;   COMBINED REDUCTION MAMMAPLASTY W/ ABDOMINOPLASTY  2021   bilateral breast implants   DILATATION & CURETTAGE/HYSTEROSCOPY WITH MYOSURE N/A 08/14/2022   Procedure: DILATATION & CURETTAGE/HYSTEROSCOPY;  Surgeon: Buba Ezzie, MD;  Location: [facility];  Service: Gynecology;  Laterality: N/A;   HYSTERECTOMY, TOTAL, LAPAROSCOPIC, ROBOT-ASSISTED WITH SALPINGECTOMY Bilateral 06/18/2023   Procedure: HYSTERECTOMY, TOTAL, LAPAROSCOPIC, ROBOT-ASSISTED WITH SALPINGECTOMY;  Surgeon: Buba Ezzie, MD;  Location: [facility];  Service:  Gynecology;  Laterality: Bilateral;   ROBOTIC ASSISTED LAPAROSCOPIC LYSIS OF ADHESION N/A 06/18/2023   Procedure: LYSIS, ADHESIONS AND OMENTAL TO ABDOMINAL WALL, ROBOT-ASSISTED, LAPAROSCOPIC;  Surgeon: Buba Ezzie, MD;  Location: [facility];  Service: Gynecology;  Laterality: N/A;   Social History   Occupational History   Not on file  Tobacco Use   Smoking status: Every Day    Current packs/day: 0.50    Average packs/day: 0.5 packs/day for 15.0 years (7.5 ttl pk-yrs)    Types: Cigarettes   Smokeless tobacco: Never   Tobacco comments:    06-11-2023  PER PT SMOKES  1 PP2-3D,  STARTED AGE  18    (SMOKED 20 YRS)  Vaping Use   Vaping status: Never Used  Substance and Sexual Activity   Alcohol use: Yes    Comment: occasional   Drug use: Never   Sexual activity: Yes    Partners: Male    Birth control/protection: Pill

## 2023-09-10 ENCOUNTER — Encounter: Payer: Self-pay | Admitting: Physical Medicine and Rehabilitation

## 2023-09-12 ENCOUNTER — Encounter: Payer: Self-pay | Admitting: Orthopaedic Surgery

## 2023-09-17 ENCOUNTER — Other Ambulatory Visit

## 2023-09-20 ENCOUNTER — Ambulatory Visit (HOSPITAL_COMMUNITY)

## 2023-10-30 ENCOUNTER — Encounter (HOSPITAL_COMMUNITY): Payer: Self-pay

## 2023-10-30 ENCOUNTER — Ambulatory Visit (HOSPITAL_COMMUNITY)

## 2023-10-30 NOTE — Therapy (Signed)
 [facility] at [facility] [address] [place], [place], [address] Phone: [phone]   Fax:  [phone]  Patient Details  Name: Allison Kane MRN: [id number] Date of Birth: [birth year] Referring Provider:  No ref. provider found  Encounter Date: 10/30/2023   Pt states she has decided to not participate in therapy as it did not help in the past. Pt states she forgot to call to cancel appointment. We will take her off the eval list, front desk notified.  Lang Ada, PT, DPT [facility] Office: [phone] 1:10 PM, 10/30/23   [facility] at [facility] [address] [place], [place], [address] Phone: [phone]   Fax:  [phone]

## 2023-10-30 NOTE — Therapy (Incomplete)
 OUTPATIENT PHYSICAL THERAPY THORACOLUMBAR EVALUATION   Patient Name: Allison Kane MRN: [id number] DOB:[birth year], 38 y.o., female Today's Date: 10/30/2023  END OF SESSION:   Past Medical History:  Diagnosis Date   GERD (gastroesophageal reflux disease)    History of cervical dysplasia 2011   s/p  leep   IBS (irritable bowel syndrome)    per pt intermittant   Irregular menstrual cycle    Migraines    Mild intermittent asthma    followed by pcp   (06-11-2023  pt stated last used rescue inhaler 2 weeks ago d/t allergies)   Seasonal allergic rhinitis    Past Surgical History:  Procedure Laterality Date   CERVICAL BIOPSY  W/ LOOP ELECTRODE EXCISION  2011   HGSIL   CESAREAN SECTION  2013   CESAREAN SECTION N/A 01/12/2014   Procedure: CESAREAN SECTION;  Surgeon: Ezzie Buba, MD;  Location: [facility];  Service: Obstetrics;  Laterality: N/A;   COMBINED REDUCTION MAMMAPLASTY W/ ABDOMINOPLASTY  2021   bilateral breast implants   DILATATION & CURETTAGE/HYSTEROSCOPY WITH MYOSURE N/A 08/14/2022   Procedure: DILATATION & CURETTAGE/HYSTEROSCOPY;  Surgeon: Buba Ezzie, MD;  Location: [facility];  Service: Gynecology;  Laterality: N/A;   HYSTERECTOMY, TOTAL, LAPAROSCOPIC, ROBOT-ASSISTED WITH SALPINGECTOMY Bilateral 06/18/2023   Procedure: HYSTERECTOMY, TOTAL, LAPAROSCOPIC, ROBOT-ASSISTED WITH SALPINGECTOMY;  Surgeon: Buba Ezzie, MD;  Location: [facility];  Service: Gynecology;  Laterality: Bilateral;   ROBOTIC ASSISTED LAPAROSCOPIC LYSIS OF ADHESION N/A 06/18/2023   Procedure: LYSIS, ADHESIONS AND OMENTAL TO ABDOMINAL WALL, ROBOT-ASSISTED, LAPAROSCOPIC;  Surgeon: Buba Ezzie, MD;  Location: [facility];  Service: Gynecology;  Laterality: N/A;   Patient Active Problem List   Diagnosis Date Noted   Abnormal uterine bleeding (AUB) 08/14/2022   Partial uterine septum 08/14/2022   Chronic low back pain 12/10/2017   Ingrown toenail 06/26/2017   History  of abnormal cervical Papanicolaou smear 05/04/2017   Nasal septal deviation 05/04/2015   Asthma 03/23/2015   GERD without esophagitis 03/23/2015   IBS (irritable bowel syndrome) 03/23/2015   S/P cesarean section 01/12/2014   History of abnormal Pap smear 09/09/2012    PCP: Nena Rosina LITTIE, PA-C   REFERRING PROVIDER: Trudy Duwaine BRAVO, NP  REFERRING DIAG: [phone] (ICD-10-CM) - Chronic bilateral thoracic back pain M79.18 (ICD-10-CM) - Myofascial pain syndrome  Rationale for Evaluation and Treatment: Rehabilitation  THERAPY DIAG:  No diagnosis found.  ONSET DATE: ***  SUBJECTIVE:  SUBJECTIVE STATEMENT: ***  PERTINENT HISTORY:  ***  PAIN:  Are you having pain? {OPRCPAIN:27236}  PRECAUTIONS: {Therapy precautions:24002}  RED FLAGS: {PT Red Flags:29287}   WEIGHT BEARING RESTRICTIONS: {Yes ***/No:24003}  FALLS:  Has patient fallen in last 6 months? {fallsyesno:27318}  LIVING ENVIRONMENT: Lives with: {OPRC lives with:25569::lives with their family} Lives in: {Lives in:25570} Stairs: {opstairs:27293} Has following equipment at home: {Assistive devices:23999}  OCCUPATION: ***  PLOF: {PLOF:24004}  PATIENT GOALS: ***  NEXT MD VISIT: ***  OBJECTIVE:  Note: Objective measures were completed at Evaluation unless otherwise noted.  DIAGNOSTIC FINDINGS:  CLINICAL DATA:  Low back pain radiating into the buttocks and legs for approximately 2 years, worsening.   EXAM: MRI LUMBAR SPINE WITHOUT CONTRAST   TECHNIQUE: Multiplanar, multisequence MR imaging of the lumbar spine was performed. No intravenous contrast was administered.   COMPARISON:  Plain films lumbar spine 05/04/2017.   FINDINGS: Segmentation:  Standard.   Alignment:  Mild convex right scoliosis noted.  No  listhesis.   Vertebrae: Height and signal are normal. No pars interarticularis defect.   Conus medullaris and cauda equina: Conus extends to the L1-2 level. Conus and cauda equina appear normal.   Paraspinal and other soft tissues: Negative.   Disc levels:   The T11-12 to L4-5 levels are negative.   L5-S1: There is a broad-based central and right side disc protrusion. The disc encroaches on the right S1 root and slightly indents the ventral thecal sac. The foramina are open.   IMPRESSION: Broad-based central and eccentric to the right protrusion at L5-S1 encroaches on the right S1 in the subarticular recesses slightly indents the ventral thecal sac.   Mild convex right scoliosis.  PATIENT SURVEYS:  Modified Oswestry: {:PHR,OPRCODI}  COGNITION: Overall cognitive status: {cognition:24006}     SENSATION: {sensation:27233}  MUSCLE LENGTH: Hamstrings: Right *** deg; Left *** deg Debby test: Right *** deg; Left *** deg  POSTURE: {posture:25561}  PALPATION: ***  LUMBAR ROM:   AROM eval  Flexion   Extension   Right lateral flexion   Left lateral flexion   Right rotation   Left rotation    (Blank rows = not tested)  LOWER EXTREMITY ROM:     {AROM/PROM:27142}  Right eval Left eval  Hip flexion    Hip extension    Hip abduction    Hip adduction    Hip internal rotation    Hip external rotation    Knee flexion    Knee extension    Ankle dorsiflexion    Ankle plantarflexion    Ankle inversion    Ankle eversion     (Blank rows = not tested)  LOWER EXTREMITY MMT:    MMT Right eval Left eval  Hip flexion    Hip extension    Hip abduction    Hip adduction    Hip internal rotation    Hip external rotation    Knee flexion    Knee extension    Ankle dorsiflexion    Ankle plantarflexion    Ankle inversion    Ankle eversion     (Blank rows = not tested)  LUMBAR SPECIAL TESTS:  {lumbar special test:25242}  FUNCTIONAL TESTS:  5 times sit to  stand: *** 2 minute walk test: ***  GAIT: Distance walked: *** Assistive device utilized: {Assistive devices:23999} Level of assistance: {Levels of assistance:24026} Comments: ***  TREATMENT DATE:  10/30/2023  Vitals: BP: HR: O2 sat: Evaluation: -ROM measured, Strength assessed, HEP prescribed, pt educated on prognosis, findings, and  importance of HEP compliance if given.  Manual Therapy: -CPA of Lumbar Spinal segments L3-L5, grade II-III mobilizations -STM of Lumbar Paraspinal musculature  Therapeutic Exercise: -Supine bridges 2 sets of 10 reps, 3 second holds, symptomatic, pt cued for max hip extension -Standing 3 way hip 1 sets 10 reps, bilaterally, pt cued for upright trunk and maintaining of neutral spine -Lateral stepping 3 laps 20 feet per lap, second 2 with RTB around ankles, pt cued for upright posture -Forward lunges, 1 set of 5 reps better performance going into RLE, pt cued for core activation and upright posture                                                                                                                                    PATIENT EDUCATION:  Education details: Pt was educated on findings of PT evaluation, prognosis, frequency of therapy visits and rationale, attendance policy, and HEP if given.   Person educated: {Person educated:25204} Education method: {Education Method:25205} Education comprehension: {Education Comprehension:25206}  HOME EXERCISE PROGRAM: ***  ASSESSMENT:  CLINICAL IMPRESSION: Patient is a 38 y.o. female who was seen today for physical therapy evaluation and treatment for M54.6,G89.29 (ICD-10-CM) - Chronic bilateral thoracic back pain M79.18 (ICD-10-CM) - Myofascial pain syndrome.   Patient demonstrates decreased LE strength, abnormal pain rating, and impaired balance. Patient also demonstrates difficulty with ambulation during today's session with decreased stride length and velocity noted. Patient also demonstrates  ***. Patient requires ***. Patient would benefit from skilled physical therapy for increased endurance with ambulation, increased LE strength, and balance for improved gait quality, return to higher level of function with ADLs, and progress towards therapy goals.   OBJECTIVE IMPAIRMENTS: {opptimpairments:25111}.   ACTIVITY LIMITATIONS: {activitylimitations:27494}  PARTICIPATION LIMITATIONS: {participationrestrictions:25113}  PERSONAL FACTORS: {Personal factors:25162} are also affecting patient's functional outcome.   REHAB POTENTIAL: {rehabpotential:25112}  CLINICAL DECISION MAKING: {clinical decision making:25114}  EVALUATION COMPLEXITY: {Evaluation complexity:25115}   GOALS: Goals reviewed with patient? {yes/no:20286}  SHORT TERM GOALS: Target date: ***  Patient will demonstrate evidence of independence with individualized HEP and will report compliance for at least 3 days per week for optimized progression towards remaining therapy goals. Baseline: *** Goal status: {GOALSTATUS:25110}  2.  Patient will report a decrease in pain level during community ambulation by at least 2 points for improved quality of life. Baseline: *** Goal status: {GOALSTATUS:25110}     LONG TERM GOALS: Target date: ***  Pt will demonstrate a an increase of at least 9 points on the LEFS for improved performance of community ambulation and ADL. Baseline: *** Goal status: {GOALSTATUS:25110}  2.  Pt will improve 2 MWT by *** in order to demonstrate improved functional ambulatory capacity in community setting.  Baseline: *** Goal status: {GOALSTATUS:25110}  3.  Pt will demonstrate WFL ROM (flexion and extension) in right knee, for increased mobility and maximal efficiency of gait cycle during ambulation. Baseline: *** Goal status: {GOALSTATUS:25110}  4.  Pt will demonstrate at least 4-/5 MMT for right lower extremity for increased strength during ADL and community ambulation. Baseline: *** Goal  status: {GOALSTATUS:25110}  5.  Pt will improve *** by *** in order to improve *** during functional activities.. Baseline: *** Goal status: {GOALSTATUS:25110}   PLAN:  PT FREQUENCY: {rehab frequency:25116}  PT DURATION: {rehab duration:25117}  PLANNED INTERVENTIONS: {rehab planned interventions:25118::97110-Therapeutic exercises,97530- Therapeutic [phone]- Neuromuscular re-education,97535- Self Rjmz,02859- Manual therapy,Patient/Family education}.  PLAN FOR NEXT SESSION: ***   Lang Ada, PT, DPT [facility] Office: [phone] 8:26 AM, 10/30/23

## 2023-12-17 ENCOUNTER — Encounter: Payer: Self-pay | Admitting: Radiology
